# Patient Record
Sex: Female | Born: 1955 | Race: White | Hispanic: No | Smoking: Former smoker
Health system: Southern US, Community
[De-identification: ages and names within clinical notes are randomized; demographics above are authoritative.]

## PROBLEM LIST (undated history)

## (undated) DIAGNOSIS — J40 Bronchitis, not specified as acute or chronic: Secondary | ICD-10-CM

## (undated) DIAGNOSIS — D649 Anemia, unspecified: Secondary | ICD-10-CM

## (undated) DIAGNOSIS — B1920 Unspecified viral hepatitis C without hepatic coma: Secondary | ICD-10-CM

## (undated) DIAGNOSIS — I509 Heart failure, unspecified: Secondary | ICD-10-CM

## (undated) DIAGNOSIS — F32A Depression, unspecified: Secondary | ICD-10-CM

## (undated) DIAGNOSIS — E119 Type 2 diabetes mellitus without complications: Secondary | ICD-10-CM

## (undated) DIAGNOSIS — K5792 Diverticulitis of intestine, part unspecified, without perforation or abscess without bleeding: Secondary | ICD-10-CM

## (undated) DIAGNOSIS — I10 Essential (primary) hypertension: Secondary | ICD-10-CM

## (undated) DIAGNOSIS — K219 Gastro-esophageal reflux disease without esophagitis: Secondary | ICD-10-CM

## (undated) DIAGNOSIS — M1990 Unspecified osteoarthritis, unspecified site: Secondary | ICD-10-CM

## (undated) DIAGNOSIS — F329 Major depressive disorder, single episode, unspecified: Secondary | ICD-10-CM

## (undated) DIAGNOSIS — E876 Hypokalemia: Secondary | ICD-10-CM

## (undated) DIAGNOSIS — M549 Dorsalgia, unspecified: Secondary | ICD-10-CM

## (undated) DIAGNOSIS — N289 Disorder of kidney and ureter, unspecified: Secondary | ICD-10-CM

## (undated) HISTORY — PX: SMALL INTESTINE SURGERY: SHX150

## (undated) HISTORY — PX: ABDOMINAL HYSTERECTOMY: SHX81

---

## 2011-11-15 ENCOUNTER — Encounter: Payer: Self-pay | Admitting: Family Medicine

## 2011-11-30 ENCOUNTER — Encounter: Payer: Self-pay | Admitting: Family Medicine

## 2012-03-23 ENCOUNTER — Emergency Department: Payer: Self-pay | Admitting: Emergency Medicine

## 2012-03-23 LAB — URINALYSIS, COMPLETE
Bacteria: NONE SEEN
Bilirubin,UR: NEGATIVE
Glucose,UR: NEGATIVE mg/dL (ref 0–75)
Ketone: NEGATIVE
Leukocyte Esterase: NEGATIVE
Nitrite: NEGATIVE

## 2013-04-26 ENCOUNTER — Emergency Department (HOSPITAL_COMMUNITY): Payer: Medicaid Other

## 2013-04-26 ENCOUNTER — Encounter (HOSPITAL_COMMUNITY): Payer: Self-pay | Admitting: Emergency Medicine

## 2013-04-26 DIAGNOSIS — I10 Essential (primary) hypertension: Secondary | ICD-10-CM | POA: Insufficient documentation

## 2013-04-26 DIAGNOSIS — R072 Precordial pain: Secondary | ICD-10-CM | POA: Insufficient documentation

## 2013-04-26 DIAGNOSIS — I509 Heart failure, unspecified: Secondary | ICD-10-CM | POA: Insufficient documentation

## 2013-04-26 DIAGNOSIS — R0602 Shortness of breath: Secondary | ICD-10-CM | POA: Insufficient documentation

## 2013-04-26 DIAGNOSIS — R071 Chest pain on breathing: Secondary | ICD-10-CM | POA: Insufficient documentation

## 2013-04-26 DIAGNOSIS — F3289 Other specified depressive episodes: Secondary | ICD-10-CM | POA: Insufficient documentation

## 2013-04-26 DIAGNOSIS — I428 Other cardiomyopathies: Secondary | ICD-10-CM | POA: Insufficient documentation

## 2013-04-26 DIAGNOSIS — E876 Hypokalemia: Secondary | ICD-10-CM | POA: Insufficient documentation

## 2013-04-26 DIAGNOSIS — D649 Anemia, unspecified: Secondary | ICD-10-CM | POA: Insufficient documentation

## 2013-04-26 DIAGNOSIS — Z8709 Personal history of other diseases of the respiratory system: Secondary | ICD-10-CM | POA: Insufficient documentation

## 2013-04-26 DIAGNOSIS — X500XXA Overexertion from strenuous movement or load, initial encounter: Secondary | ICD-10-CM | POA: Insufficient documentation

## 2013-04-26 DIAGNOSIS — K219 Gastro-esophageal reflux disease without esophagitis: Secondary | ICD-10-CM | POA: Insufficient documentation

## 2013-04-26 DIAGNOSIS — R0682 Tachypnea, not elsewhere classified: Secondary | ICD-10-CM | POA: Insufficient documentation

## 2013-04-26 DIAGNOSIS — Z8719 Personal history of other diseases of the digestive system: Secondary | ICD-10-CM | POA: Insufficient documentation

## 2013-04-26 DIAGNOSIS — Y929 Unspecified place or not applicable: Secondary | ICD-10-CM | POA: Insufficient documentation

## 2013-04-26 DIAGNOSIS — Y9389 Activity, other specified: Secondary | ICD-10-CM | POA: Insufficient documentation

## 2013-04-26 DIAGNOSIS — R1012 Left upper quadrant pain: Secondary | ICD-10-CM | POA: Insufficient documentation

## 2013-04-26 DIAGNOSIS — R064 Hyperventilation: Secondary | ICD-10-CM | POA: Insufficient documentation

## 2013-04-26 DIAGNOSIS — Z7982 Long term (current) use of aspirin: Secondary | ICD-10-CM | POA: Insufficient documentation

## 2013-04-26 DIAGNOSIS — Z79899 Other long term (current) drug therapy: Secondary | ICD-10-CM | POA: Insufficient documentation

## 2013-04-26 DIAGNOSIS — M129 Arthropathy, unspecified: Secondary | ICD-10-CM | POA: Insufficient documentation

## 2013-04-26 DIAGNOSIS — F329 Major depressive disorder, single episode, unspecified: Secondary | ICD-10-CM | POA: Insufficient documentation

## 2013-04-26 DIAGNOSIS — E119 Type 2 diabetes mellitus without complications: Secondary | ICD-10-CM | POA: Insufficient documentation

## 2013-04-26 DIAGNOSIS — R062 Wheezing: Secondary | ICD-10-CM | POA: Insufficient documentation

## 2013-04-26 DIAGNOSIS — N289 Disorder of kidney and ureter, unspecified: Secondary | ICD-10-CM | POA: Insufficient documentation

## 2013-04-26 NOTE — ED Notes (Signed)
Pt states that yesterday she leaned over the wooden railing and was trying to get a box out of the trash when she felt a pop in her left rib area. Pt states that she has been having pain ever since to her left chest wall.

## 2013-04-27 ENCOUNTER — Other Ambulatory Visit: Payer: Self-pay

## 2013-04-27 ENCOUNTER — Emergency Department (HOSPITAL_COMMUNITY): Payer: Medicaid Other

## 2013-04-27 ENCOUNTER — Emergency Department (HOSPITAL_COMMUNITY)
Admission: EM | Admit: 2013-04-27 | Discharge: 2013-04-27 | Disposition: A | Discharge status: Home / Self Care | Payer: Medicaid Other | Attending: Emergency Medicine | Admitting: Emergency Medicine

## 2013-04-27 DIAGNOSIS — I509 Heart failure, unspecified: Secondary | ICD-10-CM

## 2013-04-27 DIAGNOSIS — R0789 Other chest pain: Secondary | ICD-10-CM

## 2013-04-27 DIAGNOSIS — E876 Hypokalemia: Secondary | ICD-10-CM

## 2013-04-27 HISTORY — DX: Bronchitis, not specified as acute or chronic: J40

## 2013-04-27 HISTORY — DX: Unspecified osteoarthritis, unspecified site: M19.90

## 2013-04-27 HISTORY — DX: Major depressive disorder, single episode, unspecified: F32.9

## 2013-04-27 HISTORY — DX: Unspecified viral hepatitis C without hepatic coma: B19.20

## 2013-04-27 HISTORY — DX: Disorder of kidney and ureter, unspecified: N28.9

## 2013-04-27 HISTORY — DX: Anemia, unspecified: D64.9

## 2013-04-27 HISTORY — DX: Type 2 diabetes mellitus without complications: E11.9

## 2013-04-27 HISTORY — DX: Gastro-esophageal reflux disease without esophagitis: K21.9

## 2013-04-27 HISTORY — DX: Essential (primary) hypertension: I10

## 2013-04-27 HISTORY — DX: Heart failure, unspecified: I50.9

## 2013-04-27 HISTORY — DX: Diverticulitis of intestine, part unspecified, without perforation or abscess without bleeding: K57.92

## 2013-04-27 HISTORY — DX: Dorsalgia, unspecified: M54.9

## 2013-04-27 HISTORY — DX: Depression, unspecified: F32.A

## 2013-04-27 HISTORY — DX: Hypokalemia: E87.6

## 2013-04-27 LAB — CBC WITH DIFFERENTIAL/PLATELET
Basophils Relative: 0 % (ref 0–1)
Eosinophils Absolute: 0.1 10*3/uL (ref 0.0–0.7)
HCT: 31.3 % — ABNORMAL LOW (ref 36.0–46.0)
Hemoglobin: 10.4 g/dL — ABNORMAL LOW (ref 12.0–15.0)
Lymphocytes Relative: 25 % (ref 12–46)
Lymphs Abs: 2.2 10*3/uL (ref 0.7–4.0)
MCH: 29.6 pg (ref 26.0–34.0)
MCHC: 33.2 g/dL (ref 30.0–36.0)
MCV: 89.2 fL (ref 78.0–100.0)
Monocytes Absolute: 0.6 10*3/uL (ref 0.1–1.0)
Monocytes Relative: 6 % (ref 3–12)
Neutrophils Relative %: 68 % (ref 43–77)
RBC: 3.51 MIL/uL — ABNORMAL LOW (ref 3.87–5.11)

## 2013-04-27 LAB — BASIC METABOLIC PANEL
BUN: 28 mg/dL — ABNORMAL HIGH (ref 6–23)
CO2: 27 mEq/L (ref 19–32)
Chloride: 100 mEq/L (ref 96–112)
GFR calc non Af Amer: 57 mL/min — ABNORMAL LOW (ref >90)
Glucose, Bld: 258 mg/dL — ABNORMAL HIGH (ref 70–99)
Potassium: 2.8 mEq/L — ABNORMAL LOW (ref 3.5–5.1)

## 2013-04-27 MED ORDER — HYDROCODONE-ACETAMINOPHEN 5-325 MG PO TABS: 2.0000 | ORAL_TABLET | ORAL | Status: DC | PRN

## 2013-04-27 MED ORDER — POTASSIUM CHLORIDE ER 10 MEQ PO TBCR: 10.0000 meq | EXTENDED_RELEASE_TABLET | Freq: Every day | ORAL | Status: DC

## 2013-04-27 MED ORDER — POTASSIUM CHLORIDE CRYS ER 20 MEQ PO TBCR
40.0000 meq | EXTENDED_RELEASE_TABLET | Freq: Two times a day (BID) | ORAL | Status: DC
  Administered 2013-04-27: 40 meq via ORAL
  Filled 2013-04-27: qty 2

## 2013-04-27 MED ORDER — OXYCODONE-ACETAMINOPHEN 5-325 MG PO TABS
2.0000 | ORAL_TABLET | Freq: Once | ORAL | Status: AC
  Administered 2013-04-27: 2 via ORAL
  Filled 2013-04-27: qty 2

## 2013-04-27 MED ORDER — NAPROXEN 500 MG PO TABS: 500.0000 mg | ORAL_TABLET | Freq: Two times a day (BID) | ORAL | Status: DC

## 2013-04-27 NOTE — ED Provider Notes (Signed)
Medical screening examination/treatment/procedure(s) were performed by non-physician practitioner and as supervising physician I was immediately available for consultation/collaboration.  EKG Interpretation   None        Mikie Misner, MD 04/27/13 0213 

## 2013-04-27 NOTE — ED Provider Notes (Signed)
MSE was initiated and I personally evaluated the patient and placed orders (if any) at  1:34 AM on April 27, 2013.  Patient is a 57 year old female with a history of diabetes mellitus and congestive heart failure her presents today for left anterior chest wall pain with onset yesterday after leaning over a wooden railing to get a box out of the recycling bin. Patient has been having constant pain radiating down her left side since this time it is not relieved with gabapentin. She has not taken anything else for pain symptoms. Pain worse with deep breathing.  Patient states that, as she was sitting in the waiting room, she began to develop a substernal chest pain that is nonradiating as well as increasing shortness of breath. Patient becomes tearful and tachypneic; patient hyperventilating. Suspect symptoms secondary to anxiety and pain with inspiration; however, given her history, believe futher work up warranted on acute care side. Heart RRR and lungs CTAB on my initial exam with focal TTP to L lower anterior chest wall. EKG, troponin, BNP, and basic labs ordered. Patient hemodynamically stable.  The patient appears stable so that the remainder of the MSE may be completed by another provider.  Antony Madura, PA-C 04/27/13 0139

## 2013-04-27 NOTE — ED Provider Notes (Addendum)
CSN: [id number]     Arrival date & time 04/26/13  2208 History   First MD Initiated Contact with Patient 04/27/13 0054     Chief Complaint  Patient presents with  . Rib Injury   (Consider location/radiation/quality/duration/timing/severity/associated sxs/prior Treatment) HPI Comments: Pt is a 57 y/o female with hx of DM, Htn, CHF and chronic back pain who presents the day after she leaned over a recycling bin and felt acute onset of a pop in her L side - this is severe persistent and worse with movement, palpation ver the chest wall and berathing.  She has no cough and nos welling of the LE's.  She does feel SOB and while in the waiting room she developed CP which was in her mid chest and not associated with the L sided CP.    The history is provided by the patient.    Past Medical History  Diagnosis Date  . Hypertension   . Diabetes mellitus without complication   . Anemia   . GERD (gastroesophageal reflux disease)   . Arthritis   . Depression   . Hepatitis C   . Hypokalemia   . Diverticulitis   . Bronchitis   . Renal disorder   . Back pain   . CHF (congestive heart failure)    Past Surgical History  Procedure Laterality Date  . Cesarean section    . Abdominal hysterectomy     History reviewed. No pertinent family history. History  Substance Use Topics  . Smoking status: Never Smoker   . Smokeless tobacco: Not on file  . Alcohol Use: No   OB History   Grav Para Term Preterm Abortions TAB SAB Ect Mult Living                 Review of Systems  All other systems reviewed and are negative.    Allergies  Review of patient's allergies indicates no known allergies.  Home Medications   Current Outpatient Rx  Name  Route  Sig  Dispense  Refill  . acetaminophen (TYLENOL) 500 MG tablet   Oral   Take 500-1,000 mg by mouth every 6 (six) hours as needed.         Marland Kitchen albuterol (PROVENTIL HFA;VENTOLIN HFA) 108 (90 BASE) MCG/ACT inhaler   Inhalation   Inhale 1-2 puffs  into the lungs every 6 (six) hours as needed for wheezing or shortness of breath.         Marland Kitchen aspirin EC 81 MG tablet   Oral   Take 81 mg by mouth daily.         Marland Kitchen atorvastatin (LIPITOR) 40 MG tablet   Oral   Take 40 mg by mouth daily.         . budesonide-formoterol (SYMBICORT) 80-4.5 MCG/ACT inhaler   Inhalation   Inhale 2 puffs into the lungs 2 (two) times daily.         . carvedilol (COREG) 12.5 MG tablet   Oral   Take 12.5 mg by mouth 2 (two) times daily with a meal.         . chlorthalidone (HYGROTON) 50 MG tablet   Oral   Take 50 mg by mouth daily.         . cloNIDine (CATAPRES) 0.1 MG tablet   Oral   Take 0.1 mg by mouth 3 (three) times daily.         . fluticasone (FLONASE) 50 MCG/ACT nasal spray   Each Nare   Place 2  sprays into both nostrils daily.         Marland Kitchen gabapentin (NEURONTIN) 300 MG capsule   Oral   Take 300 mg by mouth 3 (three) times daily.         . hydrALAZINE (APRESOLINE) 100 MG tablet   Oral   Take 100 mg by mouth 3 (three) times daily.         Marland Kitchen lisinopril (PRINIVIL,ZESTRIL) 40 MG tablet   Oral   Take 40 mg by mouth daily.         . metFORMIN (GLUCOPHAGE) 500 MG tablet   Oral   Take 500 mg by mouth 2 (two) times daily with a meal.         . omeprazole (PRILOSEC) 20 MG capsule   Oral   Take 20 mg by mouth 2 (two) times daily before a meal.         . sertraline (ZOLOFT) 100 MG tablet   Oral   Take 150 mg by mouth daily.         Marland Kitchen HYDROcodone-acetaminophen (NORCO/VICODIN) 5-325 MG per tablet   Oral   Take 2 tablets by mouth every 4 (four) hours as needed.   10 tablet   0   . naproxen (NAPROSYN) 500 MG tablet   Oral   Take 1 tablet (500 mg total) by mouth 2 (two) times daily with a meal.   30 tablet   0   . potassium chloride (K-DUR) 10 MEQ tablet   Oral   Take 1 tablet (10 mEq total) by mouth daily.   7 tablet   0    BP 186/80  Pulse 59  Temp(Src) 98 F (36.7 C) (Oral)  Resp 18  Ht 5\' 1"  (1.549  m)  Wt 214 lb 9 oz (97.325 kg)  BMI 40.56 kg/m2  SpO2 99% Physical Exam  Nursing note and vitals reviewed. Constitutional: She appears well-developed and well-nourished.  HENT:  Head: Normocephalic and atraumatic.  Mouth/Throat: Oropharynx is clear and moist. No oropharyngeal exudate.  Eyes: Conjunctivae and EOM are normal. Pupils are equal, round, and reactive to light. Right eye exhibits no discharge. Left eye exhibits no discharge. No scleral icterus.  Neck: Normal range of motion. Neck supple. No JVD present. No thyromegaly present.  Cardiovascular: Normal rate, regular rhythm, normal heart sounds and intact distal pulses.  Exam reveals no gallop and no friction rub.   No murmur heard. Pulmonary/Chest: Effort normal. No respiratory distress. She has wheezes. She has no rales. She exhibits tenderness ( L sided Chest tenderness along 5th rib line laterally).  Abdominal: Soft. Bowel sounds are normal. She exhibits no distension and no mass. There is tenderness ( LUQ ttp, no guarding).  Musculoskeletal: Normal range of motion. She exhibits no edema and no tenderness.  Lymphadenopathy:    She has no cervical adenopathy.  Neurological: She is alert. Coordination normal.  Skin: Skin is warm and dry. No rash noted. No erythema.  Psychiatric: She has a normal mood and affect. Her behavior is normal.    ED Course  Procedures (including critical care time) Labs Review Labs Reviewed  PRO B NATRIURETIC PEPTIDE - Abnormal; Notable for the following:    Pro B Natriuretic peptide (BNP) 1025.0 (*)    All other components within normal limits  CBC WITH DIFFERENTIAL - Abnormal; Notable for the following:    RBC 3.51 (*)    Hemoglobin 10.4 (*)    HCT 31.3 (*)    Platelets 120 (*)  All other components within normal limits  BASIC METABOLIC PANEL - Abnormal; Notable for the following:    Potassium 2.8 (*)    Glucose, Bld 258 (*)    BUN 28 (*)    GFR calc non Af Amer 57 (*)    GFR calc Af  Amer 66 (*)    All other components within normal limits  TROPONIN I   Imaging Review Dg Chest 2 View  04/27/2013   CLINICAL DATA:  Left chest pain  EXAM: CHEST  2 VIEW  COMPARISON:  None.  FINDINGS: Mild cardiomegaly with slight vascular prominence. No CHF or pneumonia. Negative for effusion or pneumothorax. Trachea midline. Mild thoracic degenerative change.  IMPRESSION: Cardiomegaly.  No acute process   Electronically Signed   By: Ruel Favors M.D.   On: 04/27/2013 00:00   Dg Ribs Unilateral W/chest Left  04/27/2013   CLINICAL DATA:  Left mid axillary rib pain.  EXAM: LEFT RIBS AND CHEST - 3+ VIEW  COMPARISON:  Chest radiograph performed 04/26/2013  FINDINGS: No displaced rib fractures are seen.  The visualized portions of the lungs are well-aerated and clear. There is no evidence of focal opacification, pleural effusion or pneumothorax. The right lung incompletely imaged on this study.  The cardiomediastinal silhouette is mildly enlarged. No acute osseous abnormalities are seen.  IMPRESSION: No displaced rib fractures seen; no acute cardiopulmonary process identified. Mild cardiomegaly noted.   Electronically Signed   By: Roanna Raider M.D.   On: 04/27/2013 02:25    ED ECG REPORT  I personally interpreted this EKG   Date: 04/27/2013   Rate: 63  Rhythm: normal sinus rhythm  QRS Axis: left  Intervals: normal  ST/T Wave abnormalities: normal  Conduction Disutrbances:none  Narrative Interpretation:   Old EKG Reviewed: none available   MDM   1. Chest wall pain   2. Hypokalemia   3. CHF (congestive heart failure)    Pt has some hypertension - I suspect rib injury / strain of muscle, also ahs wheezing which could be lung or CHF related.  Needs further w/u as the CXR is without rib injury - klabs to eval for acute CHF.  Labs show normal WBC, normal renal function - has slight anemia and elevated BNP - she has hx of CHF and has no peripheral edema, on recheck no wheezing and potassium  given for low K.  She agrees to have f/u visit to have recheck of K and pain.  Suspect chest wall strain as there is no fracture seen on xray.  Meds given in ED:  Medications  potassium chloride SA (K-DUR,KLOR-CON) CR tablet 40 mEq (40 mEq Oral Given 04/27/13 0604)  oxyCODONE-acetaminophen (PERCOCET/ROXICET) 5-325 MG per tablet 2 tablet (2 tablets Oral Given 04/27/13 0411)    New Prescriptions   HYDROCODONE-ACETAMINOPHEN (NORCO/VICODIN) 5-325 MG PER TABLET    Take 2 tablets by mouth every 4 (four) hours as needed.   NAPROXEN (NAPROSYN) 500 MG TABLET    Take 1 tablet (500 mg total) by mouth 2 (two) times daily with a meal.   POTASSIUM CHLORIDE (K-DUR) 10 MEQ TABLET    Take 1 tablet (10 mEq total) by mouth daily.      Vida Roller, MD 04/27/13 6295  Vida Roller, MD 04/27/13 [id number]

## 2013-08-13 ENCOUNTER — Ambulatory Visit: Payer: Self-pay | Admitting: Family Medicine

## 2013-08-29 ENCOUNTER — Ambulatory Visit: Payer: Self-pay | Admitting: Family Medicine

## 2013-09-29 ENCOUNTER — Ambulatory Visit: Payer: Self-pay | Admitting: Family Medicine

## 2014-03-05 ENCOUNTER — Emergency Department: Payer: Self-pay | Admitting: Emergency Medicine

## 2014-03-06 LAB — CBC WITH DIFFERENTIAL/PLATELET
BASOS PCT: 0.3 %
Basophil #: 0 10*3/uL (ref 0.0–0.1)
Eosinophil #: 0 10*3/uL (ref 0.0–0.7)
Eosinophil %: 0.4 %
HCT: 30.1 % — ABNORMAL LOW (ref 35.0–47.0)
HGB: 9.7 g/dL — ABNORMAL LOW (ref 12.0–16.0)
LYMPHS PCT: 26.9 %
Lymphocyte #: 1.9 10*3/uL (ref 1.0–3.6)
MCH: 27.8 pg (ref 26.0–34.0)
MCHC: 32.3 g/dL (ref 32.0–36.0)
MCV: 86 fL (ref 80–100)
MONOS PCT: 6.3 %
Monocyte #: 0.5 x10 3/mm (ref 0.2–0.9)
NEUTROS PCT: 66.1 %
Neutrophil #: 4.8 10*3/uL (ref 1.4–6.5)
PLATELETS: 128 10*3/uL — AB (ref 150–440)
RBC: 3.5 10*6/uL — ABNORMAL LOW (ref 3.80–5.20)
RDW: 16.6 % — ABNORMAL HIGH (ref 11.5–14.5)
WBC: 7.2 10*3/uL (ref 3.6–11.0)

## 2014-03-06 LAB — COMPREHENSIVE METABOLIC PANEL
ALBUMIN: 2.6 g/dL — AB (ref 3.4–5.0)
ALT: 52 U/L
AST: 46 U/L — AB (ref 15–37)
Alkaline Phosphatase: 100 U/L
Anion Gap: 10 (ref 7–16)
BUN: 17 mg/dL (ref 7–18)
Bilirubin,Total: 0.2 mg/dL (ref 0.2–1.0)
CO2: 24 mmol/L (ref 21–32)
CREATININE: 1.08 mg/dL (ref 0.60–1.30)
Calcium, Total: 8.3 mg/dL — ABNORMAL LOW (ref 8.5–10.1)
Chloride: 108 mmol/L — ABNORMAL HIGH (ref 98–107)
EGFR (African American): >60
EGFR (Non-African Amer.): 56 — ABNORMAL LOW
GLUCOSE: 217 mg/dL — AB (ref 65–99)
Osmolality: 291 (ref 275–301)
POTASSIUM: 3.3 mmol/L — AB (ref 3.5–5.1)
Sodium: 142 mmol/L (ref 136–145)
Total Protein: 7.3 g/dL (ref 6.4–8.2)

## 2014-03-06 LAB — URINALYSIS, COMPLETE
BILIRUBIN, UR: NEGATIVE
Bacteria: NONE SEEN
Blood: NEGATIVE
KETONE: NEGATIVE
LEUKOCYTE ESTERASE: NEGATIVE
Nitrite: NEGATIVE
Ph: 5 (ref 4.5–8.0)
Protein: 100
RBC,UR: 1 /HPF (ref 0–5)
Specific Gravity: 1.013 (ref 1.003–1.030)
WBC UR: 2 /HPF (ref 0–5)

## 2014-03-06 LAB — TROPONIN I
Troponin-I: <0.02 ng/mL
Troponin-I: <0.02 ng/mL

## 2014-03-06 LAB — LIPASE, BLOOD: Lipase: 260 U/L (ref 73–393)

## 2014-03-20 ENCOUNTER — Ambulatory Visit: Payer: Self-pay | Admitting: Gastroenterology

## 2014-03-23 ENCOUNTER — Ambulatory Visit: Payer: Self-pay | Admitting: Gastroenterology

## 2014-08-12 ENCOUNTER — Inpatient Hospital Stay
Admit: 2014-08-12 | Disposition: A | Discharge status: Home / Self Care | Payer: Self-pay | Attending: Internal Medicine | Admitting: Internal Medicine

## 2014-08-12 LAB — COMPREHENSIVE METABOLIC PANEL
Albumin: 3.1 g/dL — ABNORMAL LOW
Alkaline Phosphatase: 94 U/L
Anion Gap: 8 (ref 7–16)
BILIRUBIN TOTAL: 0.5 mg/dL
BUN: 57 mg/dL — AB
CHLORIDE: 111 mmol/L
CREATININE: 3.83 mg/dL — AB
Calcium, Total: 8.4 mg/dL — ABNORMAL LOW
Co2: 18 mmol/L — ABNORMAL LOW
EGFR (African American): 14 — ABNORMAL LOW
EGFR (Non-African Amer.): 12 — ABNORMAL LOW
GLUCOSE: 154 mg/dL — AB
POTASSIUM: 4.3 mmol/L
SGOT(AST): 28 U/L
SGPT (ALT): 29 U/L
Sodium: 137 mmol/L
TOTAL PROTEIN: 7.1 g/dL

## 2014-08-12 LAB — CBC
HCT: 30.1 % — AB (ref 35.0–47.0)
HGB: 9.6 g/dL — ABNORMAL LOW (ref 12.0–16.0)
MCH: 27.9 pg (ref 26.0–34.0)
MCHC: 31.9 g/dL — ABNORMAL LOW (ref 32.0–36.0)
MCV: 87 fL (ref 80–100)
Platelet: 136 10*3/uL — ABNORMAL LOW (ref 150–440)
RBC: 3.45 10*6/uL — AB (ref 3.80–5.20)
RDW: 16.2 % — ABNORMAL HIGH (ref 11.5–14.5)
WBC: 6.9 10*3/uL (ref 3.6–11.0)

## 2014-08-12 LAB — URINALYSIS, COMPLETE
BACTERIA: NONE SEEN
BILIRUBIN, UR: NEGATIVE
BLOOD: NEGATIVE
Glucose,UR: NEGATIVE mg/dL (ref 0–75)
Ketone: NEGATIVE
Leukocyte Esterase: NEGATIVE
Nitrite: NEGATIVE
PH: 5 (ref 4.5–8.0)
Protein: 100
Specific Gravity: 1.021 (ref 1.003–1.030)

## 2014-08-12 LAB — AMMONIA: Ammonia, Plasma: 33 umol/L

## 2014-08-12 LAB — TROPONIN I: Troponin-I: <0.03 ng/mL

## 2014-08-13 LAB — CBC WITH DIFFERENTIAL/PLATELET
Basophil #: 0 10*3/uL (ref 0.0–0.1)
Basophil %: 0.2 %
EOS PCT: 0.8 %
Eosinophil #: 0.1 10*3/uL (ref 0.0–0.7)
HCT: 27.5 % — AB (ref 35.0–47.0)
HGB: 8.8 g/dL — ABNORMAL LOW (ref 12.0–16.0)
LYMPHS ABS: 1.7 10*3/uL (ref 1.0–3.6)
Lymphocyte %: 26.5 %
MCH: 28.2 pg (ref 26.0–34.0)
MCHC: 32.1 g/dL (ref 32.0–36.0)
MCV: 88 fL (ref 80–100)
MONO ABS: 0.4 x10 3/mm (ref 0.2–0.9)
MONOS PCT: 6.3 %
NEUTROS ABS: 4.3 10*3/uL (ref 1.4–6.5)
Neutrophil %: 66.2 %
Platelet: 132 10*3/uL — ABNORMAL LOW (ref 150–440)
RBC: 3.13 10*6/uL — ABNORMAL LOW (ref 3.80–5.20)
RDW: 16.5 % — AB (ref 11.5–14.5)
WBC: 6.5 10*3/uL (ref 3.6–11.0)

## 2014-08-13 LAB — BASIC METABOLIC PANEL
ANION GAP: 6 — AB (ref 7–16)
BUN: 58 mg/dL — ABNORMAL HIGH
CO2: 18 mmol/L — AB
Calcium, Total: 7.7 mg/dL — ABNORMAL LOW
Chloride: 116 mmol/L — ABNORMAL HIGH
Creatinine: 3.09 mg/dL — ABNORMAL HIGH
EGFR (African American): 19 — ABNORMAL LOW
EGFR (Non-African Amer.): 16 — ABNORMAL LOW
Glucose: 222 mg/dL — ABNORMAL HIGH
Potassium: 4.6 mmol/L
Sodium: 140 mmol/L

## 2014-08-14 LAB — BASIC METABOLIC PANEL
Anion Gap: 2 — ABNORMAL LOW (ref 7–16)
BUN: 40 mg/dL — ABNORMAL HIGH
CALCIUM: 8.3 mg/dL — AB
CO2: 20 mmol/L — AB
CREATININE: 1.41 mg/dL — AB
Chloride: 119 mmol/L — ABNORMAL HIGH
EGFR (African American): 48 — ABNORMAL LOW
EGFR (Non-African Amer.): 42 — ABNORMAL LOW
Glucose: 185 mg/dL — ABNORMAL HIGH
Potassium: 4.2 mmol/L
SODIUM: 141 mmol/L

## 2014-08-17 LAB — CULTURE, BLOOD (SINGLE)

## 2014-08-24 LAB — SURGICAL PATHOLOGY

## 2014-08-30 NOTE — H&P (Signed)
PATIENT NAME:  Jamie Austin, Jamie Austin MR#:  [id number] DATE OF BIRTH:  1955/10/28  DATE OF ADMISSION:  08/12/2014  CHIEF COMPLAINT: Altered mental status.   HISTORY OF PRESENT ILLNESS: This is a 59 year old female who went to her primary care physician's office today for some neck and back pain. This is a patient who has hepatitis C with subsequent cirrhosis. She is currently on hepatitis C treatment and she states that she got a "hep shot" last Monday as part of her hepatitis C treatment, that was Monday, just over a week ago. Then she had tooth extraction last Tuesday, just over a week ago. For the past 3-4 days, she has been having intermittent fevers, rhinorrhea, headache, nausea with no vomiting, although she has been having diarrhea. She states she has not been having any cough She also denies muscle aches and joint pains. She says that she had started feeling somewhat better, but then at her physician's office today she was very sleepy throughout the visit. Her blood pressure was low, and so they sent her here. In the ED, she was found to have a significant AKI with creatinine of 3.83 as well as a low blood pressure, which responded somewhat to fluids. Hospitalists were called for admission for acute kidney injury.   PRIMARY CARE PHYSICIAN: Lanora Manis B. White, FNP   PAST MEDICAL HISTORY: Cirrhosis, diastolic congestive heart failure, GERD, depression, hypertension, diabetes mellitus, hepatitis C.   MEDICATIONS: She takes Delmar Landau she takes 1 pack daily; this includes medications ombitasvir 12.5 mg, paritaprevir 75 mg, ritonavir 50 mg; she takes those 3 in the morning and then dasabuvir 250 mg, she takes in the evening. She is also taking sertraline 100 mg 1-1/2 tablets daily, potassium 20 mEq b.i.d., Prilosec 20 mEq daily, metformin 1000 mg b.i.d., Mag-Ox, lisinopril 40 mg daily, HCTZ/triamterene combo pill 25/37.5 mg daily, hydralazine 100 mg t.i.d., gabapentin 800 mg t.i.d., furosemide 20 mg daily, iron,  clonidine 0.2 mg b.i.d., Coreg 12.5 mg b.i.d., atorvastatin 40 mg daily, aspirin 81 mg daily, albuterol inhaler 2 puffs q. 6 hours as needed, amoxicillin 500 mg t.i.d.     PAST SURGICAL HISTORY: Hysterectomy and C-section x 2.   ALLERGIES: No known drug allergies.   FAMILY HISTORY: Hypertension, CAD, diabetes mellitus, cancer.   SOCIAL HISTORY: Ex-smoker, quit 4 or 5 years ago. She denies alcohol use. States that she used marijuana and IV cocaine a long time ago, has not used any illicit drugs for the last 20 years.   REVIEW OF SYSTEMS:  CONSTITUTIONAL: Endorses subjective fever, fatigue, and weakness.  EYES: Denies blurred or double vision, pain or redness.  EAR, NOSE, AND THROAT: Denies ear pain, hearing loss, difficulty swallowing.  RESPIRATORY: Denies cough, dyspnea, or painful respiration.  CARDIOVASCULAR: Denies chest pain, edema, or palpitations.  GASTROINTESTINAL: Endorses nausea. Denies vomiting. Endorses diarrhea. Denies abdominal pain or constipation.  GENITOURINARY: Denies dysuria, hematuria, or frequency.  ENDOCRINE: Denies nocturia, thyroid problems, heat or cold intolerance.  HEMATOLOGIC AND LYMPHATIC: Denies easy bruising, bleeding, swollen glands.  INTEGUMENTARY: Denies acne, rash, or lesion.  MUSCULOSKELETAL: Endorses some neck and shoulder pain. Denies joint swelling or gout.  NEUROLOGICAL: Denies numbness, dysarthria, or headache.  PSYCHIATRIC: Denies anxiety, insomnia, depression. Other pertinent review of systems points per HPI.   PHYSICAL EXAMINATION:  VITAL SIGNS: Blood pressure 199/58, pulse 59, temperature 97.9, respirations 20 with 98% oxygen saturations on room air.  GENERAL: This is a well-nourished female lying supine in bed in no acute distress.  HEENT: Pupils equal,  round, and reactive to light and accommodation. Extraocular movements intact. No scleral icterus. Dry mucosal membranes.  NECK: Thyroid is not enlarged. Neck is supple, no masses, nontender. No  cervical adenopathy. No JVD.  RESPIRATORY: Clear to auscultation bilaterally with no rales, rhonchi, or wheezing. No respiratory distress.  CARDIOVASCULAR: Regular rate and rhythm with no murmurs, rubs, or gallops on exam. Good pedal pulses with no lower extremity edema.  ABDOMEN: Soft, nontender, nondistended with good bowel sounds.  MUSCULOSKELETAL: Muscular strength 5/5 in all 4 extremities. Full spontaneous range of motion throughout. No cyanosis or clubbing.  SKIN: No rash or lesions. Skin is warm, dry, and intact.  LYMPHATIC: No adenopathy.  NEUROLOGICAL: Cranial nerves intact. Sensation intact throughout. No dysarthria or aphasia.  PSYCHIATRIC: Alert and oriented x 3, cooperative, not confused.   LABORATORY DATA: White count 6.9, hemoglobin 9.6, hematocrit 30.1, platelets 136,000. Sodium 137, potassium 4.3, chloride 111, bicarbonate 18, BUN 57, creatinine 3.83, glucose 154, calcium 8.4. Total protein 7.1, albumin 3.1, total bilirubin 0.5, alkaline phosphatase 94, AST 28, ALT 29. Troponin less than 0.03. Urinalysis negative. Ammonia 33.   RADIOLOGY: Chest x-ray: Cardiomegaly with vascular congestion. Head CT: Indeterminate 6 mm hypoattenuating focus just lateral to the right caudate head may represent an acute to subacute lacunar infarct, dilated perivascular space or sequela chronic microvascular ischemic changes, left posterior fossa subarachnoid cyst.   ASSESSMENT AND PLAN:  1.  Acute kidney injury. This is likely due to dehydration from her viral illness and her diarrhea as well as maybe some of her antihypertensive medications. We will rehydrate her here with fluids. We will hold her antihypertensives for now. We will hold all renal toxic medications and we will continue to monitor her.  2.  Hypotension. This is fluid responsive. We will hold her antihypertensive medications for now except for clonidine to avoid withdrawal, which we will restart at a lower dose.  3.  Recent tooth  extraction. The patient is on amoxicillin for this. We will continue her amoxicillin for now but we will change the dosing to q. 12 due to her renal status.  4.  Diabetes mellitus. We will hold her metformin due to her renal failure. We will put her on sliding scale insulin and order a low sodium diabetic diet.  5.  Hepatitis C. We will continue her treatment medications for this.  6.  Gastroesophageal reflux disease. We will have her on Protonix while here in the hospital.  7.  Depression. We will continue her selective serotonin reuptake inhibitor while inpatient.  8.  Deep vein thrombosis prophylaxis. Subcutaneous Lovenox heparin.   CODE STATUS: This patient is full code.   TIME SPENT ON THIS ADMISSION: 50 minutes.    ____________________________ Candace Cruiseavid F. Anne HahnWillis, MD dfw:bm D: 08/13/2014 01:51:29 ET T: 08/13/2014 02:28:13 ET JOB#: [id number]  cc: Candace Cruiseavid F. Anne HahnWillis, MD, <Dictator> Asif Muchow Scotty CourtF Brenae Lasecki MD ELECTRONICALLY SIGNED 08/13/2014 5:20

## 2014-08-30 NOTE — Discharge Summary (Signed)
PATIENT NAME:  Jamie ReichmannJEFFERS, Nakeisha MR#:  [id number] DATE OF BIRTH:  1955/05/11  DATE OF ADMISSION:  08/12/2014 DATE OF DISCHARGE:  08/14/2014  DISCHARGE DIAGNOSES:  1.  Acute renal failure. 2.  Altered mental status, likely metabolic encephalopathy. 3.  History of hepatitis C. 4.  Diabetes. 5.  Depression.  DISPOSITION: The patient is being discharged home with home health, physical therapy and nursing services.  ACTIVITY: As tolerated.  DISCHARGE INSTRUCTIONS: Follow up with Ms. Doristine Mangolizabeth White and Dr. Mady HaagensenMunsoor Lateef in the next 1 to 2 weeks.  DISCHARGE MEDICATIONS: Zoloft 100 mg 1-1/2 tabs daily, aspirin 81 mg daily, albuterol inhaler 2 puffs every 6 hours as needed, clonidine 0.2 mg b.i.d., Lasix 20 mg daily, hydralazine 100 mg t.i.d., omeprazole 20 mg b.i.d., Gabapentin 800 mg t.i.d., atorvastatin 40 mg daily, Coreg 12.5 mg b.i.d., iron sulfate 325 mg daily, lisinopril 40 mg daily, magnesium oxide daily, Metformin 1000 mg b.i.d., nystatin topical to be applied b.i.d. as needed, Viekira 1 dose b.i.d., potassium 20 mEq b.i.d., hydrochlorothiazide/triamterene 1 tab daily.  NOTE: The patient is to hold her Lasix, triamterene/ HCTZ and Aldactone through the weekend and to resume on Monday.  CONSULTANTS DURING HOSPITAL COURSE: Dr. Mady HaagensenMunsoor Lateef from nephrology.  PERTINENT STUDIES DURING HOSPITAL COURSE: CT scan of the head done without contrast showed no evidence of any acute intracranial process. Left posterior fossa subarachnoid cyst. Chronic microvascular ischemic change.   Chest x-ray done on April 13th showed cardiomegaly with vascular congestion.  Ultrasound of the kidney showed no evidence of hydronephrosis, right renal cortical atrophy, small right renal cyst.  BRIEF HOSPITAL COURSE: This is a 59 year old female with history of hep-C, diabetes, hypertension, hyperlipidemia, and depression who presented to the hospital with altered mental status and noted to be in acute renal failure.  1.   Acute renal failure. This is likely due to volume loss and dehydration. The patient was apparently on multiple diuretics and had poor p.o. intake. The patient was admitted to the hospital, started on aggressive IV fluid hydration. Her creatinine has significantly improved, is now close to baseline. Her renal ultrasound showed no evidence of any hydronephrosis. Since her renal function is close to baseline, she is therefore being discharged to home. She is still strongly advised to hold her diuretics until her p.o. intake improves, which would likely be in the next 2 to 3 days. 2.  Altered mental status, encephalopathy. This is likely suspected to be secondary to metabolic encephalopathy from acute renal failure. Her ammonia level was normal; therefore, there was no evidence of hepatic encephalopathy. She had a CT of head done which showed a possible subacute/acute CVA, although that is unlikely the cause of the patient's mental status change. Her urinalysis is negative. As her renal function has improved, her mental status is now back to baseline. 3.  Hypertension. The patient remained hemodynamically stable. She will continue her clonidine. 4.  Diabetes. The patient was maintained on some sliding scale insulin due to her acute renal failure, although she will resume her metformin as her creatinine is back to baseline. 5.  Depression. The patient was maintained on her Zoloft. She will resume that. 6.  History of hepatitis C. The patient was maintained on her Heloise PurpuraViekira, which she will continue. 7.  Hyperlipidemia. The patient was maintained on atorvastatin. She will resume that.  The patient is a FULL code. She is being discharged home with home health physical therapy and nursing services.   TIME SPENT ON DISCHARGE: 45 minutes.  ____________________________ Rolly Pancake. Cherlynn Kaiser, MD vjs:sb D: 08/14/2014 16:16:51 ET T: 08/14/2014 17:19:20 ET JOB#: [id number]  cc: Rolly Pancake. Cherlynn Kaiser, MD, <Dictator> Munsoor Lizabeth Leyden, MD Courtney Paris. [place], FNP [place] Siren MD ELECTRONICALLY SIGNED 08/19/2014 11:31

## 2014-09-07 ENCOUNTER — Other Ambulatory Visit: Payer: Self-pay

## 2014-10-07 ENCOUNTER — Emergency Department
Admission: EM | Admit: 2014-10-07 | Discharge: 2014-10-07 | Disposition: A | Discharge status: Home / Self Care | Payer: Medicaid Other | Attending: Emergency Medicine | Admitting: Emergency Medicine

## 2014-10-07 DIAGNOSIS — I10 Essential (primary) hypertension: Secondary | ICD-10-CM | POA: Insufficient documentation

## 2014-10-07 DIAGNOSIS — Z87891 Personal history of nicotine dependence: Secondary | ICD-10-CM | POA: Diagnosis not present

## 2014-10-07 DIAGNOSIS — E1165 Type 2 diabetes mellitus with hyperglycemia: Secondary | ICD-10-CM | POA: Insufficient documentation

## 2014-10-07 DIAGNOSIS — Z7982 Long term (current) use of aspirin: Secondary | ICD-10-CM | POA: Diagnosis not present

## 2014-10-07 DIAGNOSIS — Z7951 Long term (current) use of inhaled steroids: Secondary | ICD-10-CM | POA: Insufficient documentation

## 2014-10-07 DIAGNOSIS — R109 Unspecified abdominal pain: Secondary | ICD-10-CM | POA: Insufficient documentation

## 2014-10-07 DIAGNOSIS — Z79899 Other long term (current) drug therapy: Secondary | ICD-10-CM | POA: Insufficient documentation

## 2014-10-07 DIAGNOSIS — R739 Hyperglycemia, unspecified: Secondary | ICD-10-CM

## 2014-10-07 LAB — BASIC METABOLIC PANEL
ANION GAP: 10 (ref 5–15)
BUN: 27 mg/dL — ABNORMAL HIGH (ref 6–20)
CHLORIDE: 108 mmol/L (ref 101–111)
CO2: 22 mmol/L (ref 22–32)
Calcium: 8.9 mg/dL (ref 8.9–10.3)
Creatinine, Ser: 0.94 mg/dL (ref 0.44–1.00)
GFR calc Af Amer: >60 mL/min (ref >60)
GFR calc non Af Amer: >60 mL/min (ref >60)
GLUCOSE: 211 mg/dL — AB (ref 65–99)
Potassium: 3.7 mmol/L (ref 3.5–5.1)
Sodium: 140 mmol/L (ref 135–145)

## 2014-10-07 LAB — URINALYSIS COMPLETE WITH MICROSCOPIC (ARMC ONLY)
Bacteria, UA: NONE SEEN
Bilirubin Urine: NEGATIVE
Glucose, UA: >500 mg/dL — AB
HGB URINE DIPSTICK: NEGATIVE
KETONES UR: NEGATIVE mg/dL
Leukocytes, UA: NEGATIVE
Nitrite: NEGATIVE
PH: 6 (ref 5.0–8.0)
Protein, ur: NEGATIVE mg/dL
Specific Gravity, Urine: 1.009 (ref 1.005–1.030)

## 2014-10-07 LAB — CBC WITH DIFFERENTIAL/PLATELET
Basophils Absolute: 0 10*3/uL (ref 0–0.1)
Basophils Relative: 0 %
Eosinophils Absolute: 0 10*3/uL (ref 0–0.7)
Eosinophils Relative: 0 %
HCT: 28.3 % — ABNORMAL LOW (ref 35.0–47.0)
Hemoglobin: 9.3 g/dL — ABNORMAL LOW (ref 12.0–16.0)
Lymphocytes Relative: 21 %
Lymphs Abs: 1.4 10*3/uL (ref 1.0–3.6)
MCH: 29.4 pg (ref 26.0–34.0)
MCHC: 32.7 g/dL (ref 32.0–36.0)
MCV: 89.8 fL (ref 80.0–100.0)
MONO ABS: 0.3 10*3/uL (ref 0.2–0.9)
Neutro Abs: 5 10*3/uL (ref 1.4–6.5)
Neutrophils Relative %: 74 %
PLATELETS: 90 10*3/uL — AB (ref 150–440)
RBC: 3.15 MIL/uL — ABNORMAL LOW (ref 3.80–5.20)
RDW: 15.8 % — AB (ref 11.5–14.5)
WBC: 6.8 10*3/uL (ref 3.6–11.0)

## 2014-10-07 LAB — GLUCOSE, CAPILLARY: Glucose-Capillary: 94 mg/dL (ref 65–99)

## 2014-10-07 LAB — HEPATIC FUNCTION PANEL
ALK PHOS: 68 U/L (ref 38–126)
ALT: 17 U/L (ref 14–54)
AST: 25 U/L (ref 15–41)
Albumin: 3.3 g/dL — ABNORMAL LOW (ref 3.5–5.0)
Bilirubin, Direct: <0.1 mg/dL — ABNORMAL LOW (ref 0.1–0.5)
Total Bilirubin: 0.3 mg/dL (ref 0.3–1.2)
Total Protein: 7.5 g/dL (ref 6.5–8.1)

## 2014-10-07 LAB — LIPASE, BLOOD: LIPASE: 66 U/L — AB (ref 22–51)

## 2014-10-07 MED ORDER — SODIUM CHLORIDE 0.9 % IV BOLUS (SEPSIS)
500.0000 mL | Freq: Once | INTRAVENOUS | Status: AC
  Administered 2014-10-07: 500 mL via INTRAVENOUS

## 2014-10-07 NOTE — Discharge Instructions (Signed)

## 2014-10-07 NOTE — ED Notes (Signed)
Blood sugar was low last night 61. Did take metformin last night around 8PM. Woke up this AM and did not feel good. Was 96 this AM. Ate cereal with some sugar and checked again and was about 344. Called MD's office and they called EMS for her.

## 2014-10-07 NOTE — ED Provider Notes (Signed)
[facility] Emergency Department Provider Note  ____________________________________________  Time seen: 12:15 PM  I have reviewed the triage vital signs and the nursing notes.   HISTORY  Chief Complaint Hyperglycemia and Hypertension    HPI Jamie Austin is a 59 y.o. female who complains of hyperglycemia with associated "numbness" of the head. She has that she's had trouble controlling her blood sugar since yesterday. Her blood sugar was low last night around 60, so she ate cake and had juice before going to bed. She felt tired at that time but without any other symptoms. She woke up this morning and continued to feel sluggish found her blood sugar was in the 90s. She ate cereal with extra sugar added and when she rechecked her blood sugar was about 350. Around that time she started to have the above head sensation. Denies numbness tingling or weakness in the extremities, vision change, abdominal pain nausea vomiting diarrhea, chest pain shortness of breath, cough sore throat runny nose, dysuria frequency urgency hematuria. No polyuria or polydipsia  Her primary care doctor recently added another hypoglycemic medicine, glipizide, about 3 weeks ago. The patient reports that she has been taking it since then.   Past Medical History  Diagnosis Date  . Hypertension   . Diabetes mellitus without complication   . Anemia   . GERD (gastroesophageal reflux disease)   . Arthritis   . Depression   . Hepatitis C   . Hypokalemia   . Diverticulitis   . Bronchitis   . Renal disorder   . Back pain   . CHF (congestive heart failure)     There are no active problems to display for this patient.   Past Surgical History  Procedure Laterality Date  . Cesarean section    . Abdominal hysterectomy      Current Outpatient Rx  Name  Route  Sig  Dispense  Refill  . acetaminophen (TYLENOL) 500 MG tablet   Oral   Take 500-1,000 mg by mouth every 6 (six) hours as needed.        Marland Kitchen albuterol (PROVENTIL HFA;VENTOLIN HFA) 108 (90 BASE) MCG/ACT inhaler   Inhalation   Inhale 1-2 puffs into the lungs every 6 (six) hours as needed for wheezing or shortness of breath.         Marland Kitchen aspirin EC 81 MG tablet   Oral   Take 81 mg by mouth daily.         Marland Kitchen atorvastatin (LIPITOR) 40 MG tablet   Oral   Take 40 mg by mouth daily.         . budesonide-formoterol (SYMBICORT) 80-4.5 MCG/ACT inhaler   Inhalation   Inhale 2 puffs into the lungs 2 (two) times daily.         . carvedilol (COREG) 12.5 MG tablet   Oral   Take 12.5 mg by mouth 2 (two) times daily with a meal.         . chlorthalidone (HYGROTON) 50 MG tablet   Oral   Take 50 mg by mouth daily.         . cloNIDine (CATAPRES) 0.1 MG tablet   Oral   Take 0.1 mg by mouth 3 (three) times daily.         . fluticasone (FLONASE) 50 MCG/ACT nasal spray   Each Nare   Place 2 sprays into both nostrils daily.         Marland Kitchen gabapentin (NEURONTIN) 300 MG capsule   Oral  Take 300 mg by mouth 3 (three) times daily.         . hydrALAZINE (APRESOLINE) 100 MG tablet   Oral   Take 100 mg by mouth 3 (three) times daily.         Marland Kitchen. HYDROcodone-acetaminophen (NORCO/VICODIN) 5-325 MG per tablet   Oral   Take 2 tablets by mouth every 4 (four) hours as needed.   10 tablet   0   . lisinopril (PRINIVIL,ZESTRIL) 40 MG tablet   Oral   Take 40 mg by mouth daily.         . metFORMIN (GLUCOPHAGE) 500 MG tablet   Oral   Take 500 mg by mouth 2 (two) times daily with a meal.         . naproxen (NAPROSYN) 500 MG tablet   Oral   Take 1 tablet (500 mg total) by mouth 2 (two) times daily with a meal.   30 tablet   0   . omeprazole (PRILOSEC) 20 MG capsule   Oral   Take 20 mg by mouth 2 (two) times daily before a meal.         . potassium chloride (K-DUR) 10 MEQ tablet   Oral   Take 1 tablet (10 mEq total) by mouth daily.   7 tablet   0   . sertraline (ZOLOFT) 100 MG tablet   Oral   Take 150 mg  by mouth daily.           Allergies Review of patient's allergies indicates no known allergies.  No family history on file.  Social History History  Substance Use Topics  . Smoking status: Former Smoker    Quit date: 10/06/2009  . Smokeless tobacco: Not on file  . Alcohol Use: No    Review of Systems  Constitutional: No fever or chills. No weight changes Eyes:No blurry vision or double vision.  ENT: No sore throat. Cardiovascular: No chest pain. Respiratory: No dyspnea or cough. Gastrointestinal: Negative for abdominal pain, vomiting and diarrhea.  No BRBPR or melena. Genitourinary: Negative for dysuria, urinary retention, bloody urine, or difficulty urinating. Musculoskeletal: Negative for back pain. No joint swelling or pain. Skin: Negative for rash. Neurological: Negative for headaches, focal weakness or numbness. Positive for unusual cranial sensation. Psychiatric:No anxiety or depression.   Endocrine:No hot/cold intolerance, changes in energy, or sleep difficulty.  10-point ROS otherwise negative.  ____________________________________________   PHYSICAL EXAM:  VITAL SIGNS: ED Triage Vitals  Enc Vitals Group     BP 10/07/14 1210 131/63 mmHg     Pulse Rate 10/07/14 1210 61     Resp --      Temp 10/07/14 1210 98.2 F (36.8 C)     Temp Source 10/07/14 1210 Oral     SpO2 10/07/14 1210 98 %     Weight 10/07/14 1210 229 lb 4.5 oz (104 kg)     Height 10/07/14 1210 5\' 2"  (1.575 m)     Head Cir --      Peak Flow --      Pain Score 10/07/14 1211 0     Pain Loc --      Pain Edu? --      Excl. in GC? --      Constitutional: Alert and oriented. Well appearing and in no distress. Low energy Eyes: No scleral icterus. No conjunctival pallor. PERRL. EOMI ENT   Head: Normocephalic and atraumatic.   Nose: No congestion/rhinnorhea. No septal hematoma   Mouth/Throat: MMM, no pharyngeal  erythema. No peritonsillar mass. No uvula shift.   Neck: No  stridor. No SubQ emphysema. No meningismus. Hematological/Lymphatic/Immunilogical: No cervical lymphadenopathy. Cardiovascular: RRR. Normal and symmetric distal pulses are present in all extremities. No murmurs, rubs, or gallops. Respiratory: Normal respiratory effort without tachypnea nor retractions. Breath sounds are clear and equal bilaterally. No wheezes/rales/rhonchi. Gastrointestinal: Bilateral upper quadrant tenderness, mild.. No distention. There is no CVA tenderness.  No rebound, rigidity, or guarding. Genitourinary: deferred Musculoskeletal: Nontender with normal range of motion in all extremities. No joint effusions.  No lower extremity tenderness.  No edema. Neurologic:   Normal speech and language.  CN 2-10 normal. Motor grossly intact. No pronator drift.  Normal gait. No gross focal neurologic deficits are appreciated.  Skin:  Skin is warm, dry and intact. No rash noted.  No petechiae, purpura, or bullae. Psychiatric: Mood and affect are normal. Speech and behavior are normal. Patient exhibits appropriate insight and judgment.  ____________________________________________    LABS (pertinent positives/negatives) (all labs ordered are listed, but only abnormal results are displayed) Labs Reviewed  BASIC METABOLIC PANEL - Abnormal; Notable for the following:    Glucose, Bld 211 (*)    BUN 27 (*)    All other components within normal limits  HEPATIC FUNCTION PANEL - Abnormal; Notable for the following:    Albumin 3.3 (*)    Bilirubin, Direct <0.1 (*)    All other components within normal limits  LIPASE, BLOOD - Abnormal; Notable for the following:    Lipase 66 (*)    All other components within normal limits  CBC WITH DIFFERENTIAL/PLATELET - Abnormal; Notable for the following:    RBC 3.15 (*)    Hemoglobin 9.3 (*)    HCT 28.3 (*)    RDW 15.8 (*)    Platelets 90 (*)    All other components within normal limits  URINALYSIS COMPLETEWITH MICROSCOPIC ([facility] ONLY) -  Abnormal; Notable for the following:    Color, Urine YELLOW (*)    APPearance CLEAR (*)    Glucose, UA >500 (*)    Squamous Epithelial / LPF 0-5 (*)    All other components within normal limits  GLUCOSE, CAPILLARY  CBG MONITORING, ED   ____________________________________________   EKG    ____________________________________________    RADIOLOGY    ____________________________________________   PROCEDURES  ____________________________________________   INITIAL IMPRESSION / ASSESSMENT AND PLAN / ED COURSE  Pertinent labs & imaging results that were available during my care of the patient were reviewed by me and considered in my medical decision making (see chart for details).  Low suspicion of sepsis, stroke, intracranial hemorrhage or thrombosis. Her sensation is consistent with glycemic instability related to a new medication change. As a response to some episodic hypoglycemia, the patient is now adding sugar to her diet and therefore causing large swings in her blood sugar which are likely affecting her energy level and causing strange sensations. We'll give her IV fluids for her blood sugar of 340 today and check some labs. ----------------------------------------- 2:22 PM on 10/07/2014 -----------------------------------------  Repeat blood sugar 96. Patient feeling much better and back to normal. Labs unremarkable. No ketosis or acidosis. We'll discharge home to follow up with her primary care doctor. Due to episodes of hypoglycemia, although advised patient to be sure she eats regular meals and to avoid using added sugar in her regular foods. She should still use rapidly absorbed sugars when needed for acute hypoglycemia.Marland Kitchen  ----------------------------------------- 2:54 PM on 10/07/2014 -----------------------------------------  Labs unremarkable except for  mildly elevated lipase. LFTs are normal save low suspicion of choledocholithiasis or cholangitis. She may  have a mild pancreatitis but not clinically significant TO the course currently. Patient is well-appearing no acute distress back to baseline. We'll discharge her home and have her follow up with primary care ____________________________________________   FINAL CLINICAL IMPRESSION(S) / ED DIAGNOSES  Final diagnoses:  Hyperglycemia      Sharman Cheek, MD 10/07/14 1455

## 2014-11-03 ENCOUNTER — Emergency Department: Payer: Medicaid Other

## 2014-11-03 ENCOUNTER — Observation Stay
Admit: 2014-11-03 | Discharge: 2014-11-03 | Disposition: A | Discharge status: Home / Self Care | Payer: Medicaid Other | Attending: Internal Medicine | Admitting: Internal Medicine

## 2014-11-03 ENCOUNTER — Inpatient Hospital Stay
Admission: EM | Admit: 2014-11-03 | Discharge: 2014-11-06 | DRG: 314 | Disposition: A | Discharge status: Home / Self Care | Payer: Medicaid Other | Attending: Internal Medicine | Admitting: Internal Medicine

## 2014-11-03 ENCOUNTER — Encounter: Payer: Self-pay | Admitting: Emergency Medicine

## 2014-11-03 DIAGNOSIS — I248 Other forms of acute ischemic heart disease: Secondary | ICD-10-CM | POA: Diagnosis present

## 2014-11-03 DIAGNOSIS — R778 Other specified abnormalities of plasma proteins: Secondary | ICD-10-CM

## 2014-11-03 DIAGNOSIS — E11649 Type 2 diabetes mellitus with hypoglycemia without coma: Secondary | ICD-10-CM | POA: Diagnosis present

## 2014-11-03 DIAGNOSIS — F329 Major depressive disorder, single episode, unspecified: Secondary | ICD-10-CM | POA: Diagnosis present

## 2014-11-03 DIAGNOSIS — E785 Hyperlipidemia, unspecified: Secondary | ICD-10-CM | POA: Diagnosis present

## 2014-11-03 DIAGNOSIS — E860 Dehydration: Secondary | ICD-10-CM | POA: Diagnosis present

## 2014-11-03 DIAGNOSIS — R7989 Other specified abnormal findings of blood chemistry: Secondary | ICD-10-CM

## 2014-11-03 DIAGNOSIS — Z8249 Family history of ischemic heart disease and other diseases of the circulatory system: Secondary | ICD-10-CM

## 2014-11-03 DIAGNOSIS — R42 Dizziness and giddiness: Secondary | ICD-10-CM

## 2014-11-03 DIAGNOSIS — I509 Heart failure, unspecified: Secondary | ICD-10-CM | POA: Diagnosis present

## 2014-11-03 DIAGNOSIS — E1140 Type 2 diabetes mellitus with diabetic neuropathy, unspecified: Secondary | ICD-10-CM | POA: Diagnosis present

## 2014-11-03 DIAGNOSIS — E876 Hypokalemia: Secondary | ICD-10-CM | POA: Diagnosis present

## 2014-11-03 DIAGNOSIS — R55 Syncope and collapse: Secondary | ICD-10-CM | POA: Diagnosis present

## 2014-11-03 DIAGNOSIS — Z87891 Personal history of nicotine dependence: Secondary | ICD-10-CM

## 2014-11-03 DIAGNOSIS — I10 Essential (primary) hypertension: Secondary | ICD-10-CM | POA: Diagnosis present

## 2014-11-03 DIAGNOSIS — M549 Dorsalgia, unspecified: Secondary | ICD-10-CM | POA: Diagnosis present

## 2014-11-03 DIAGNOSIS — I618 Other nontraumatic intracerebral hemorrhage: Secondary | ICD-10-CM | POA: Diagnosis present

## 2014-11-03 DIAGNOSIS — I611 Nontraumatic intracerebral hemorrhage in hemisphere, cortical: Secondary | ICD-10-CM | POA: Diagnosis present

## 2014-11-03 DIAGNOSIS — K219 Gastro-esophageal reflux disease without esophagitis: Secondary | ICD-10-CM | POA: Diagnosis present

## 2014-11-03 DIAGNOSIS — Y92009 Unspecified place in unspecified non-institutional (private) residence as the place of occurrence of the external cause: Secondary | ICD-10-CM

## 2014-11-03 DIAGNOSIS — T447X5A Adverse effect of beta-adrenoreceptor antagonists, initial encounter: Secondary | ICD-10-CM | POA: Diagnosis present

## 2014-11-03 DIAGNOSIS — I959 Hypotension, unspecified: Principal | ICD-10-CM | POA: Diagnosis present

## 2014-11-03 DIAGNOSIS — M1990 Unspecified osteoarthritis, unspecified site: Secondary | ICD-10-CM | POA: Diagnosis present

## 2014-11-03 DIAGNOSIS — H6690 Otitis media, unspecified, unspecified ear: Secondary | ICD-10-CM | POA: Diagnosis present

## 2014-11-03 DIAGNOSIS — D649 Anemia, unspecified: Secondary | ICD-10-CM | POA: Diagnosis present

## 2014-11-03 LAB — CBC WITH DIFFERENTIAL/PLATELET
Basophils Absolute: 0 10*3/uL (ref 0–0.1)
Basophils Relative: 0 %
Eosinophils Absolute: 0 10*3/uL (ref 0–0.7)
Eosinophils Relative: 0 %
HCT: 32.4 % — ABNORMAL LOW (ref 35.0–47.0)
Hemoglobin: 10.6 g/dL — ABNORMAL LOW (ref 12.0–16.0)
LYMPHS PCT: 24 %
Lymphs Abs: 1.7 10*3/uL (ref 1.0–3.6)
MCH: 29 pg (ref 26.0–34.0)
MCHC: 32.8 g/dL (ref 32.0–36.0)
MCV: 88.4 fL (ref 80.0–100.0)
MONO ABS: 0.5 10*3/uL (ref 0.2–0.9)
Monocytes Relative: 7 %
Neutro Abs: 4.9 10*3/uL (ref 1.4–6.5)
Neutrophils Relative %: 69 %
PLATELETS: 134 10*3/uL — AB (ref 150–440)
RBC: 3.67 MIL/uL — ABNORMAL LOW (ref 3.80–5.20)
RDW: 14.7 % — AB (ref 11.5–14.5)
WBC: 7.2 10*3/uL (ref 3.6–11.0)

## 2014-11-03 LAB — HEMOGLOBIN A1C: HEMOGLOBIN A1C: 6.4 % — AB (ref 4.0–6.0)

## 2014-11-03 LAB — COMPREHENSIVE METABOLIC PANEL
ALBUMIN: 3.1 g/dL — AB (ref 3.5–5.0)
ALT: 40 U/L (ref 14–54)
AST: 57 U/L — ABNORMAL HIGH (ref 15–41)
Alkaline Phosphatase: 82 U/L (ref 38–126)
Anion gap: 11 (ref 5–15)
BUN: 21 mg/dL — ABNORMAL HIGH (ref 6–20)
CO2: 24 mmol/L (ref 22–32)
CREATININE: 1.4 mg/dL — AB (ref 0.44–1.00)
Calcium: 8.6 mg/dL — ABNORMAL LOW (ref 8.9–10.3)
Chloride: 104 mmol/L (ref 101–111)
GFR calc Af Amer: 48 mL/min — ABNORMAL LOW (ref >60)
GFR calc non Af Amer: 41 mL/min — ABNORMAL LOW (ref >60)
Glucose, Bld: 287 mg/dL — ABNORMAL HIGH (ref 65–99)
Potassium: 3 mmol/L — ABNORMAL LOW (ref 3.5–5.1)
Sodium: 139 mmol/L (ref 135–145)
TOTAL PROTEIN: 7 g/dL (ref 6.5–8.1)
Total Bilirubin: 0.3 mg/dL (ref 0.3–1.2)

## 2014-11-03 LAB — GLUCOSE, CAPILLARY
GLUCOSE-CAPILLARY: 172 mg/dL — AB (ref 65–99)
Glucose-Capillary: 233 mg/dL — ABNORMAL HIGH (ref 65–99)
Glucose-Capillary: 155 mg/dL — ABNORMAL HIGH (ref 65–99)

## 2014-11-03 LAB — URINALYSIS COMPLETE WITH MICROSCOPIC (ARMC ONLY)
Bilirubin Urine: NEGATIVE
Glucose, UA: 50 mg/dL — AB
Hgb urine dipstick: NEGATIVE
KETONES UR: NEGATIVE mg/dL
LEUKOCYTES UA: NEGATIVE
NITRITE: NEGATIVE
PROTEIN: 100 mg/dL — AB
Specific Gravity, Urine: 1.021 (ref 1.005–1.030)
Trans Epithel, UA: 1
pH: 5 (ref 5.0–8.0)

## 2014-11-03 LAB — TROPONIN I
TROPONIN I: 0.13 ng/mL — AB (ref <0.031)
Troponin I: 0.16 ng/mL — ABNORMAL HIGH (ref <0.031)
Troponin I: 0.11 ng/mL — ABNORMAL HIGH (ref <0.031)

## 2014-11-03 LAB — CREATININE, SERUM
CREATININE: 1.41 mg/dL — AB (ref 0.44–1.00)
GFR calc Af Amer: 47 mL/min — ABNORMAL LOW (ref >60)
GFR, EST NON AFRICAN AMERICAN: 41 mL/min — AB (ref >60)

## 2014-11-03 LAB — CBC
HCT: 33.3 % — ABNORMAL LOW (ref 35.0–47.0)
Hemoglobin: 11.1 g/dL — ABNORMAL LOW (ref 12.0–16.0)
MCH: 29.4 pg (ref 26.0–34.0)
MCHC: 33.2 g/dL (ref 32.0–36.0)
MCV: 88.5 fL (ref 80.0–100.0)
Platelets: 142 10*3/uL — ABNORMAL LOW (ref 150–440)
RBC: 3.76 MIL/uL — ABNORMAL LOW (ref 3.80–5.20)
RDW: 14.8 % — AB (ref 11.5–14.5)
WBC: 8.1 10*3/uL (ref 3.6–11.0)

## 2014-11-03 LAB — MAGNESIUM: MAGNESIUM: 2 mg/dL (ref 1.7–2.4)

## 2014-11-03 LAB — AMMONIA: Ammonia: 26 umol/L (ref 9–35)

## 2014-11-03 MED ORDER — ENOXAPARIN SODIUM 100 MG/ML ~~LOC~~ SOLN
SUBCUTANEOUS | Status: AC
  Administered 2014-11-03: 40 mg via SUBCUTANEOUS
  Filled 2014-11-03: qty 1

## 2014-11-03 MED ORDER — INSULIN ASPART 100 UNIT/ML ~~LOC~~ SOLN: 0.0000 [IU] | Freq: Every day | SUBCUTANEOUS | Status: DC

## 2014-11-03 MED ORDER — POTASSIUM CHLORIDE 20 MEQ PO PACK
PACK | ORAL | Status: AC
  Administered 2014-11-03: 40 meq via ORAL
  Filled 2014-11-03: qty 2

## 2014-11-03 MED ORDER — CHLORHEXIDINE GLUCONATE 0.12 % MT SOLN: 15.0000 mL | Freq: Two times a day (BID) | OROMUCOSAL | Status: DC

## 2014-11-03 MED ORDER — ACETAMINOPHEN 650 MG RE SUPP
650.0000 mg | Freq: Four times a day (QID) | RECTAL | Status: DC | PRN
Start: 2014-11-03 — End: 2014-11-06

## 2014-11-03 MED ORDER — POTASSIUM CHLORIDE IN NACL 20-0.9 MEQ/L-% IV SOLN
INTRAVENOUS | Status: DC
  Administered 2014-11-03 – 2014-11-04 (×2): via INTRAVENOUS
  Filled 2014-11-03 (×5): qty 1000

## 2014-11-03 MED ORDER — PANTOPRAZOLE SODIUM 40 MG PO TBEC
40.0000 mg | DELAYED_RELEASE_TABLET | Freq: Every day | ORAL | Status: DC
  Administered 2014-11-03 – 2014-11-06 (×4): 40 mg via ORAL
  Filled 2014-11-03 (×4): qty 1

## 2014-11-03 MED ORDER — ATORVASTATIN CALCIUM 20 MG PO TABS
40.0000 mg | ORAL_TABLET | Freq: Every day | ORAL | Status: DC
  Administered 2014-11-03 – 2014-11-06 (×4): 40 mg via ORAL
  Filled 2014-11-03 (×4): qty 2

## 2014-11-03 MED ORDER — FERROUS SULFATE 325 (65 FE) MG PO TABS
325.0000 mg | ORAL_TABLET | Freq: Every day | ORAL | Status: DC
  Administered 2014-11-04 – 2014-11-06 (×3): 325 mg via ORAL
  Filled 2014-11-03 (×3): qty 1

## 2014-11-03 MED ORDER — ASPIRIN EC 81 MG PO TBEC
81.0000 mg | DELAYED_RELEASE_TABLET | Freq: Every day | ORAL | Status: DC
  Administered 2014-11-03 – 2014-11-06 (×4): 81 mg via ORAL
  Filled 2014-11-03 (×3): qty 1

## 2014-11-03 MED ORDER — AMOXICILLIN-POT CLAVULANATE 875-125 MG PO TABS
1.0000 | ORAL_TABLET | Freq: Two times a day (BID) | ORAL | Status: DC
  Administered 2014-11-03 – 2014-11-05 (×6): 1 via ORAL
  Filled 2014-11-03 (×6): qty 1

## 2014-11-03 MED ORDER — SODIUM CHLORIDE 0.9 % IJ SOLN
3.0000 mL | Freq: Two times a day (BID) | INTRAMUSCULAR | Status: DC
  Administered 2014-11-03 – 2014-11-06 (×6): 3 mL via INTRAVENOUS

## 2014-11-03 MED ORDER — POTASSIUM CHLORIDE 20 MEQ PO PACK
40.0000 meq | PACK | Freq: Once | ORAL | Status: AC
  Administered 2014-11-03: 40 meq via ORAL

## 2014-11-03 MED ORDER — NYSTATIN 100000 UNIT/GM EX CREA
1.0000 "application " | TOPICAL_CREAM | Freq: Two times a day (BID) | CUTANEOUS | Status: DC
  Administered 2014-11-03 – 2014-11-06 (×6): 1 via TOPICAL
  Filled 2014-11-03: qty 15

## 2014-11-03 MED ORDER — ENOXAPARIN SODIUM 40 MG/0.4ML ~~LOC~~ SOLN
40.0000 mg | SUBCUTANEOUS | Status: DC
  Administered 2014-11-03 – 2014-11-04 (×2): 40 mg via SUBCUTANEOUS
  Filled 2014-11-03: qty 0.4

## 2014-11-03 MED ORDER — INSULIN ASPART 100 UNIT/ML ~~LOC~~ SOLN
0.0000 [IU] | Freq: Three times a day (TID) | SUBCUTANEOUS | Status: DC
  Administered 2014-11-03 – 2014-11-06 (×7): 2 [IU] via SUBCUTANEOUS
  Filled 2014-11-03 (×7): qty 2

## 2014-11-03 MED ORDER — POTASSIUM CHLORIDE IN NACL 20-0.9 MEQ/L-% IV SOLN
INTRAVENOUS | Status: AC
  Filled 2014-11-03: qty 1000

## 2014-11-03 MED ORDER — ASPIRIN EC 81 MG PO TBEC
DELAYED_RELEASE_TABLET | ORAL | Status: AC
  Administered 2014-11-03: 81 mg via ORAL
  Filled 2014-11-03: qty 1

## 2014-11-03 MED ORDER — ACETAMINOPHEN 325 MG PO TABS
650.0000 mg | ORAL_TABLET | Freq: Four times a day (QID) | ORAL | Status: DC | PRN
  Administered 2014-11-05: 650 mg via ORAL
  Filled 2014-11-03: qty 2

## 2014-11-03 MED ORDER — AMOXICILLIN-POT CLAVULANATE 875-125 MG PO TABS
ORAL_TABLET | ORAL | Status: AC
  Administered 2014-11-03: 1 via ORAL
  Filled 2014-11-03: qty 1

## 2014-11-03 MED ORDER — GABAPENTIN 400 MG PO CAPS
800.0000 mg | ORAL_CAPSULE | Freq: Three times a day (TID) | ORAL | Status: DC
  Administered 2014-11-03 – 2014-11-06 (×9): 800 mg via ORAL
  Filled 2014-11-03 (×9): qty 2

## 2014-11-03 MED ORDER — SERTRALINE HCL 50 MG PO TABS
150.0000 mg | ORAL_TABLET | Freq: Every day | ORAL | Status: DC
  Administered 2014-11-04 – 2014-11-06 (×3): 150 mg via ORAL
  Filled 2014-11-03 (×3): qty 3

## 2014-11-03 MED ORDER — CHLORHEXIDINE GLUCONATE 0.12 % MT SOLN
15.0000 mL | Freq: Two times a day (BID) | OROMUCOSAL | Status: DC
  Administered 2014-11-04 – 2014-11-06 (×5): 15 mL via OROMUCOSAL
  Filled 2014-11-03: qty 15

## 2014-11-03 NOTE — Consult Note (Signed)
[facility] Cardiology  CARDIOLOGY CONSULT NOTE  Patient ID: Guy Toney MRN: [id number] DOB/AGE: 08/30/1955 59 y.o.  Admit date: 11/03/2014 Referring Physician [place] Primary Physician Louretta Parma NP Primary Cardiologist Dorothyann Peng M.D. Reason for Consultation elevated troponin  HPI: 59 year old female referred for evaluation of elevated troponin. The patient has a history of hypertension on multiple BP medications. The patient had 6 teeth removed the last week and has had much decreased intake. Patient also says recent nausea and vomiting. The patient presented to [facility] emergency room with lightheadedness and weakness. She was noted to be hypotensive. Upon was borderline elevated 0.13 of chest pain or ECG changes. The patient was treated with intravenous fluid resuscitation with overall clinical improvement and normalization of blood pressure.  Review of systems complete and found to be negative unless listed above     Past Medical History  Diagnosis Date  . Hypertension   . Diabetes mellitus without complication   . Anemia   . GERD (gastroesophageal reflux disease)   . Arthritis   . Depression   . Hepatitis C   . Hypokalemia   . Diverticulitis   . Bronchitis   . Renal disorder   . Back pain   . CHF (congestive heart failure)     Past Surgical History  Procedure Laterality Date  . Cesarean section    . Abdominal hysterectomy    . Small intestine surgery N/A     Complication of hysterectomy    Prescriptions prior to admission  Medication Sig Dispense Refill Last Dose  . albuterol (PROVENTIL HFA;VENTOLIN HFA) 108 (90 BASE) MCG/ACT inhaler Inhale 2 puffs into the lungs every 6 (six) hours as needed for wheezing.    unknown  . amLODipine (NORVASC) 10 MG tablet Take 10 mg by mouth daily.   11/02/2014 at am  . aspirin EC 81 MG tablet Take 81 mg by mouth daily.   11/02/2014 at am  . atorvastatin (LIPITOR) 40 MG tablet Take 40 mg by mouth daily.   11/02/2014 at pm  . carvedilol  (COREG) 25 MG tablet Take 25 mg by mouth 2 (two) times daily with a meal.   11/02/2014 at pm  . cloNIDine (CATAPRES) 0.1 MG tablet Take 0.1 mg by mouth 2 (two) times daily.    11/02/2014 at pm  . ferrous sulfate 325 (65 FE) MG tablet Take 325 mg by mouth daily with breakfast.   11/02/2014 at am  . gabapentin (NEURONTIN) 400 MG capsule Take 800 mg by mouth 3 (three) times daily.   11/02/2014 at pm  . hydrALAZINE (APRESOLINE) 100 MG tablet Take 100 mg by mouth 3 (three) times daily.   11/02/2014 at pm  . lisinopril (PRINIVIL,ZESTRIL) 40 MG tablet Take 40 mg by mouth daily.   11/02/2014 at am  . metFORMIN (GLUCOPHAGE) 1000 MG tablet Take 1,000 mg by mouth 2 (two) times daily with a meal.   11/02/2014 at pm  . nystatin cream (MYCOSTATIN) Apply 1 application topically 2 (two) times daily.   11/02/2014 at pm  . omeprazole (PRILOSEC) 20 MG capsule Take 20 mg by mouth 2 (two) times daily before a meal.   11/02/2014 at pm  . potassium chloride SA (K-DUR,KLOR-CON) 20 MEQ tablet Take 20 mEq by mouth 2 (two) times daily.   11/02/2014 at pm  . sertraline (ZOLOFT) 100 MG tablet Take 150 mg by mouth daily.   11/02/2014 at am  . triamterene-hydrochlorothiazide (DYAZIDE) 37.5-25 MG per capsule Take 1 capsule by mouth every morning.   11/02/2014  at am   History   Social History  . Marital Status: Legally Separated    Spouse Name: N/A  . Number of Children: N/A  . Years of Education: N/A   Occupational History  . Not on file.   Social History Main Topics  . Smoking status: Former Smoker    Quit date: 10/06/2009  . Smokeless tobacco: Not on file  . Alcohol Use: No  . Drug Use: No  . Sexual Activity: Not on file   Other Topics Concern  . Not on file   Social History Narrative    Family History  Problem Relation Age of Onset  . Cancer Mother   . Coronary artery disease Father       Review of systems complete and found to be negative unless listed above      PHYSICAL EXAM  General: Well developed, well  nourished, in no acute distress HEENT:  Normocephalic and atramatic Neck:  No JVD.  Lungs: Clear bilaterally to auscultation and percussion. Heart: HRRR . Normal S1 and S2 without gallops or murmurs.  Abdomen: Bowel sounds are positive, abdomen soft and non-tender  Msk:  Back normal, normal gait. Normal strength and tone for age. Extremities: No clubbing, cyanosis or edema.   Neuro: Alert and oriented X 3. Psych:  Good affect, responds appropriately  Labs:   Lab Results  Component Value Date   WBC 8.1 11/03/2014   HGB 11.1* 11/03/2014   HCT 33.3* 11/03/2014   MCV 88.5 11/03/2014   PLT 142* 11/03/2014    Recent Labs Lab 11/03/14 0913 11/03/14 1311  NA 139  --   K 3.0*  --   CL 104  --   CO2 24  --   BUN 21*  --   CREATININE 1.40* 1.41*  CALCIUM 8.6*  --   PROT 7.0  --   BILITOT 0.3  --   ALKPHOS 82  --   ALT 40  --   AST 57*  --   GLUCOSE 287*  --    Lab Results  Component Value Date   TROPONINI 0.13* 11/03/2014   No results found for: CHOL No results found for: HDL No results found for: LDLCALC No results found for: TRIG No results found for: CHOLHDL No results found for: LDLDIRECT    Radiology: Ct Head Wo Contrast  11/03/2014   CLINICAL DATA:  Decreased level of consciousness, somnolent, history hypertension, diabetes mellitus, hepatitis-C, CHF, GERD, anemia  EXAM: CT HEAD WITHOUT CONTRAST  TECHNIQUE: Contiguous axial images were obtained from the base of the skull through the vertex without intravenous contrast.  COMPARISON:  08/12/2014  FINDINGS: Generalized atrophy.  Prominent cisterna magna, normal variant.  No midline shift or mass effect.  Streak artifacts at skull base.  Otherwise normal appearance of brain parenchyma.  No intracranial hemorrhage, mass lesion or evidence acute infarction.  No extra-axial fluid collections.  Bones and sinuses unremarkable.  Area of questionable low-attenuation lateral to the RIGHT caudate head on the previous exam is much less  conspicuous.  IMPRESSION: No acute intracranial abnormalities.   Electronically Signed   By: Ulyses Southward M.D.   On: 11/03/2014 10:05   Dg Chest Portable 1 View  11/03/2014   CLINICAL DATA:  Decreased level of consciousness today.  Weakness.  EXAM: PORTABLE CHEST - 1 VIEW  COMPARISON:  Single view of the chest 08/12/2014.  FINDINGS: Heart size is enlarged but there is no pulmonary edema. The lungs are clear. No pneumothorax or pleural  fluid.  IMPRESSION: Cardiomegaly without acute disease.   Electronically Signed   By: Drusilla Kannerhomas  Dalessio M.D.   On: 11/03/2014 09:33    EKG: Sinus rhythm  ASSESSMENT AND PLAN:   59 year old female who presents with hypotension, following recent removal of 6 teeth and much decreased po intake consequent dehydration. Troponin was borderline elevated without peak trough, without chest pain or new ECG changes. The troponin likely due to demand supply ischemia and not due to acute coronary syndrome.  Recommendations  1. Continue current medications 2. Continue IV hydration 3. Defer full dose anticoagulation 4. Defer further cardiac diagnostics at this time 5. Resume prior BP medications as needed following IV hydration and normalization of BP  Signed: Muaad Boehning MD,PhD, [facility] 11/03/2014, 5:35 PM

## 2014-11-03 NOTE — ED Notes (Signed)
Per lab, troponin .16, potassium 3.0

## 2014-11-03 NOTE — ED Notes (Signed)
Pt now awake - talking on cell phone. Seem to be gcs 15

## 2014-11-03 NOTE — ED Provider Notes (Signed)
[facility] Emergency Department Provider Note  ____________________________________________  Time seen: On arrival  I have reviewed the triage vital signs and the nursing notes.   HISTORY  Chief Complaint Altered Mental Status    HPI Jamie Austin is a 59 y.o. female who was sent from her PCPs office for hypoglycemia and decreased level of consciousness apparently. Patient presents with eyes closed but will answer questions appropriately when asked. Review of records and demonstrates a history of similar instances of decreased level of consciousness. Blood sugar checked and stable.     Past Medical History  Diagnosis Date  . Hypertension   . Diabetes mellitus without complication   . Anemia   . GERD (gastroesophageal reflux disease)   . Arthritis   . Depression   . Hepatitis C   . Hypokalemia   . Diverticulitis   . Bronchitis   . Renal disorder   . Back pain   . CHF (congestive heart failure)     There are no active problems to display for this patient.   Past Surgical History  Procedure Laterality Date  . Cesarean section    . Abdominal hysterectomy      Current Outpatient Rx  Name  Route  Sig  Dispense  Refill  . acetaminophen (TYLENOL) 500 MG tablet   Oral   Take 500-1,000 mg by mouth every 6 (six) hours as needed.         Marland Kitchen albuterol (PROVENTIL HFA;VENTOLIN HFA) 108 (90 BASE) MCG/ACT inhaler   Inhalation   Inhale 1-2 puffs into the lungs every 6 (six) hours as needed for wheezing or shortness of breath.         Marland Kitchen aspirin EC 81 MG tablet   Oral   Take 81 mg by mouth daily.         Marland Kitchen atorvastatin (LIPITOR) 40 MG tablet   Oral   Take 40 mg by mouth daily.         . budesonide-formoterol (SYMBICORT) 80-4.5 MCG/ACT inhaler   Inhalation   Inhale 2 puffs into the lungs 2 (two) times daily.         . carvedilol (COREG) 12.5 MG tablet   Oral   Take 12.5 mg by mouth 2 (two) times daily with a meal.         .  chlorthalidone (HYGROTON) 50 MG tablet   Oral   Take 50 mg by mouth daily.         . cloNIDine (CATAPRES) 0.1 MG tablet   Oral   Take 0.1 mg by mouth 3 (three) times daily.         . fluticasone (FLONASE) 50 MCG/ACT nasal spray   Each Nare   Place 2 sprays into both nostrils daily.         Marland Kitchen gabapentin (NEURONTIN) 300 MG capsule   Oral   Take 300 mg by mouth 3 (three) times daily.         . hydrALAZINE (APRESOLINE) 100 MG tablet   Oral   Take 100 mg by mouth 3 (three) times daily.         Marland Kitchen HYDROcodone-acetaminophen (NORCO/VICODIN) 5-325 MG per tablet   Oral   Take 2 tablets by mouth every 4 (four) hours as needed.   10 tablet   0   . lisinopril (PRINIVIL,ZESTRIL) 40 MG tablet   Oral   Take 40 mg by mouth daily.         . metFORMIN (GLUCOPHAGE) 500 MG  tablet   Oral   Take 500 mg by mouth 2 (two) times daily with a meal.         . naproxen (NAPROSYN) 500 MG tablet   Oral   Take 1 tablet (500 mg total) by mouth 2 (two) times daily with a meal.   30 tablet   0   . omeprazole (PRILOSEC) 20 MG capsule   Oral   Take 20 mg by mouth 2 (two) times daily before a meal.         . potassium chloride (K-DUR) 10 MEQ tablet   Oral   Take 1 tablet (10 mEq total) by mouth daily.   7 tablet   0   . sertraline (ZOLOFT) 100 MG tablet   Oral   Take 150 mg by mouth daily.           Allergies Review of patient's allergies indicates no known allergies.  History reviewed. No pertinent family history.  Social History History  Substance Use Topics  . Smoking status: Former Smoker    Quit date: 10/06/2009  . Smokeless tobacco: Not on file  . Alcohol Use: No    Review of Systems  Constitutional: Negative for fever. Eyes: Negative for visual changes. ENT: Negative for sore throat Cardiovascular: Negative for chest pain. Respiratory: Negative for shortness of breath. Gastrointestinal: Negative for abdominal pain, vomiting and diarrhea. Genitourinary:  Negative for dysuria. Musculoskeletal: Negative for back pain. Skin: Negative for rash. Neurological: Negative for headaches or focal weakness   10-point ROS otherwise negative.  ____________________________________________   PHYSICAL EXAM:  VITAL SIGNS: ED Triage Vitals  Enc Vitals Group     BP 11/03/14 0917 101/65 mmHg     Pulse Rate 11/03/14 0917 73     Resp 11/03/14 0917 17     Temp --      Temp src --      SpO2 11/03/14 0917 96 %     Weight 11/03/14 0917 203 lb (92.08 kg)     Height 11/03/14 0917  (1.651 m)     Head Cir --      Peak Flow --      Pain Score --      Pain Loc --      Pain Edu? --      Excl. in GC? --     Constitutional: Eyes closed, answers questions when asked Eyes: Conjunctivae are normal. PERRLA ENT   Head: Normocephalic and atraumatic.   Mouth/Throat: Mucous membranes are moist. Cardiovascular: Normal rate, regular rhythm. Normal and symmetric distal pulses are present in all extremities. No murmurs, rubs, or gallops. Respiratory: Normal respiratory effort without tachypnea nor retractions. Breath sounds are clear and equal bilaterally.  Gastrointestinal: Soft and non-tender in all quadrants. No distention. There is no CVA tenderness. Genitourinary: deferred Musculoskeletal: Nontender with normal range of motion in all extremities. No lower extremity tenderness nor edema. Neurologic:  Normal speech and language. No gross focal neurologic deficits are appreciated. Skin:  Skin is warm, dry and intact. No rash noted.   ____________________________________________    LABS (pertinent positives/negatives)  Labs Reviewed  GLUCOSE, CAPILLARY - Abnormal; Notable for the following:    Glucose-Capillary 233 (*)    All other components within normal limits  CBC WITH DIFFERENTIAL/PLATELET  COMPREHENSIVE METABOLIC PANEL  AMMONIA  TROPONIN I  URINALYSIS COMPLETEWITH MICROSCOPIC ([facility] ONLY)  CBG MONITORING, ED     ____________________________________________   EKG  ED ECG REPORT I, Jene Every, the attending physician, personally  viewed and interpreted this ECG.  Date: 11/03/2014 EKG Time: 9:15 AM Rate: 66 Rhythm: normal sinus rhythm QRS Axis: normal Intervals: normal ST/T Wave abnormalities: normal Conduction Disutrbances: none Narrative Interpretation: unremarkable   ____________________________________________    RADIOLOGY I have personally reviewed any xrays that were ordered on this patient: Chest x-ray is unremarkable  CT head unremarkable  ____________________________________________   PROCEDURES  Procedure(s) performed: none  Critical Care performed: yes CRITICAL CARE Performed by: Jene EveryKINNER, Doria Fern   Total critical care time: 30  Critical care time was exclusive of separately billable procedures and treating other patients.  Critical care was necessary to treat or prevent imminent or life-threatening deterioration.  Critical care was time spent personally by me on the following activities: development of treatment plan with patient and/or surrogate as well as nursing, discussions with consultants, evaluation of patient's response to treatment, examination of patient, obtaining history from patient or surrogate, ordering and performing treatments and interventions, ordering and review of laboratory studies, ordering and review of radiographic studies, pulse oximetry and re-evaluation of patient's condition.   ____________________________________________   INITIAL IMPRESSION / ASSESSMENT AND PLAN / ED COURSE  Pertinent labs & imaging results that were available during my care of the patient were reviewed by me and considered in my medical decision making (see chart for details).  She initially seemed unresponsive to EMS but gradually started to respond more and more. She will answer questions appropriately if I continually ask her. She seems to eventually  get frustrated and answer me.  Nurse tells me that she is speaking on the telephone now. Given unclear cause of LOC we will obtain blood tests, check blood sugars, check urine chest x-ray and a CT head  ----------------------------------------- 10:59 AM on 11/03/2014 -----------------------------------------  Patient with elevated troponin near syncopal episode EKG is unremarkable and patient has not had any chest pain however I will admit the patient for further workup given elevated cardiac enzymes ____________________________________________   FINAL CLINICAL IMPRESSION(S) / ED DIAGNOSES  Final diagnoses:  Syncope, near     Jene Everyobert Paige Vanderwoude, MD 11/03/14 1059

## 2014-11-03 NOTE — ED Notes (Signed)
Ems from [facility] urgent care. Pt with decreased level of consciousness. Very sleepy. Will arouse briefly when stimulated. No obvious cause

## 2014-11-03 NOTE — H&P (Signed)
[facility] [place] [facility]   PATIENT NAME: Jamie Austin    MR#:  [id number]  DATE OF BIRTH:  [birth year]  DATE OF ADMISSION:  11/03/2014  PRIMARY CARE PHYSICIAN: WHITE, Arlyss Repress, NP   REQUESTING/REFERRING PHYSICIAN: Jene Every, M.D.  CHIEF COMPLAINT:   Chief Complaint  Patient presents with  . Altered Mental Status    HISTORY OF PRESENT ILLNESS:  Jamie Austin  is a 59 y.o. female with a known history of hypertension, diabetes, gastroesophageal reflux disease, hyperlipidemia, anemia. She was going for a blood pressure check today and felt that she could hardly walk. She felt like she was going to pass out. She felt weak and hot and clammy and the room was spinning to the right. Now her head feels weird and she did also feel like she was spinning when she got up to go the bathroom here in the ER. On Thursday and Friday she had some nausea vomiting. Of note she had 6 teeth pulled last Wednesday on the top. Her medical doctor is been playing around with the dose of her clonidine. In the emergency room she was found to have a borderline troponin and hypokalemia and her blood pressure was on the lower side. Hospitalist services were contacted for further evaluation.  PAST MEDICAL HISTORY:   Past Medical History  Diagnosis Date  . Hypertension   . Diabetes mellitus without complication   . Anemia   . GERD (gastroesophageal reflux disease)   . Arthritis   . Depression   . Hepatitis C   . Hypokalemia   . Diverticulitis   . Bronchitis   . Renal disorder   . Back pain   . CHF (congestive heart failure)     PAST SURGICAL HISTORY:   Past Surgical History  Procedure Laterality Date  . Cesarean section    . Abdominal hysterectomy    . Small intestine surgery N/A     Complication of hysterectomy    SOCIAL HISTORY:   History  Substance Use Topics  . Smoking status: Former Smoker    Quit date: 10/06/2009  . Smokeless tobacco: Not on  file  . Alcohol Use: No    FAMILY HISTORY:   Family History  Problem Relation Age of Onset  . Cancer Mother   . Coronary artery disease Father     DRUG ALLERGIES:  No Known Allergies  REVIEW OF SYSTEMS:  CONSTITUTIONAL: Questionable fever, positive for clammy feeling, decreased appetite, positive for fatigue and weakness.  EYES: No blurred or double vision. Wears glasses. EARS, NOSE, AND THROAT: No tinnitus or ear pain. No sore throat. Positive for runny nose. RESPIRATORY: No cough, occasional shortness of breath, no wheezing or hemoptysis.  CARDIOVASCULAR: No chest pain, orthopnea, edema.  GASTROINTESTINAL: On Thursday and Friday had nausea and vomiting, no abdominal pain. No blood in bowel movements. Positive for constipation. GENITOURINARY: No dysuria, hematuria.  ENDOCRINE: No polyuria, nocturia,  HEMATOLOGY: Positive for anemia, no easy bruising or bleeding. SKIN: No rash or lesion. MUSCULOSKELETAL: No joint pain or arthritis.   NEUROLOGIC: No tingling, numbness, weakness. Felt faint. PSYCHIATRY: No anxiety, positive for depression.   MEDICATIONS AT HOME:   Prior to Admission medications   Medication Sig Start Date End Date Taking? Authorizing Provider  albuterol (PROVENTIL HFA;VENTOLIN HFA) 108 (90 BASE) MCG/ACT inhaler Inhale 2 puffs into the lungs every 6 (six) hours as needed for wheezing.    Yes Historical Provider, MD  amLODipine (NORVASC) 10 MG tablet Take 10  mg by mouth daily.   Yes Historical Provider, MD  aspirin EC 81 MG tablet Take 81 mg by mouth daily.   Yes Historical Provider, MD  atorvastatin (LIPITOR) 40 MG tablet Take 40 mg by mouth daily.   Yes Historical Provider, MD  carvedilol (COREG) 25 MG tablet Take 25 mg by mouth 2 (two) times daily with a meal.   Yes Historical Provider, MD  cloNIDine (CATAPRES) 0.1 MG tablet Take 0.1 mg by mouth 2 (two) times daily.    Yes Historical Provider, MD  ferrous sulfate 325 (65 FE) MG tablet Take 325 mg by mouth daily  with breakfast.   Yes Historical Provider, MD  gabapentin (NEURONTIN) 400 MG capsule Take 800 mg by mouth 3 (three) times daily.   Yes Historical Provider, MD  hydrALAZINE (APRESOLINE) 100 MG tablet Take 100 mg by mouth 3 (three) times daily.   Yes Historical Provider, MD  lisinopril (PRINIVIL,ZESTRIL) 40 MG tablet Take 40 mg by mouth daily.   Yes Historical Provider, MD  metFORMIN (GLUCOPHAGE) 1000 MG tablet Take 1,000 mg by mouth 2 (two) times daily with a meal.   Yes Historical Provider, MD  nystatin cream (MYCOSTATIN) Apply 1 application topically 2 (two) times daily.   Yes Historical Provider, MD  omeprazole (PRILOSEC) 20 MG capsule Take 20 mg by mouth 2 (two) times daily before a meal.   Yes Historical Provider, MD  potassium chloride SA (K-DUR,KLOR-CON) 20 MEQ tablet Take 20 mEq by mouth 2 (two) times daily.   Yes Historical Provider, MD  sertraline (ZOLOFT) 100 MG tablet Take 150 mg by mouth daily.   Yes Historical Provider, MD  triamterene-hydrochlorothiazide (DYAZIDE) 37.5-25 MG per capsule Take 1 capsule by mouth every morning.   Yes Historical Provider, MD      VITAL SIGNS:  Blood pressure 116/68, pulse 66, resp. rate 19, height  (1.651 m), weight 92.08 kg (203 lb), SpO2 97 %.  PHYSICAL EXAMINATION:  GENERAL:  59 y.o.-year-old patient lying in the bed with no acute distress.  EYES: Pupils equal, round, reactive to light and accommodation. No scleral icterus. Extraocular muscles intact.  HEENT: Head atraumatic, normocephalic. Oropharynx and nasopharynx clear. Tympanic membrane on the left erythematous and bulging. A brownish area on sites of tooth removal. NECK:  Supple, no jugular venous distention. No thyroid enlargement, no tenderness.  LUNGS: Normal breath sounds bilaterally, no wheezing, rales,rhonchi or crepitation. No use of accessory muscles of respiration.  CARDIOVASCULAR: S1, S2 normal. No murmurs, rubs, or gallops.  ABDOMEN: Soft, nontender, nondistended. Bowel  sounds present. No organomegaly or mass.  EXTREMITIES: 2+ edema, no cyanosis, or clubbing.  NEUROLOGIC: Cranial nerves II through XII are intact. Muscle strength 5/5 in all extremities. Sensation intact. Gait not checked.  PSYCHIATRIC: The patient is alert and oriented x 3.  SKIN: Chronic lower extremity discoloration bilaterally on the shins.  LABORATORY PANEL:   CBC  Recent Labs Lab 11/03/14 0913  WBC 7.2  HGB 10.6*  HCT 32.4*  PLT 134*   Chemistries   Recent Labs Lab 11/03/14 0913  NA 139  K 3.0*  CL 104  CO2 24  GLUCOSE 287*  BUN 21*  CREATININE 1.40*  CALCIUM 8.6*  AST 57*  ALT 40  ALKPHOS 82  BILITOT 0.3   Cardiac Enzymes  Recent Labs Lab 11/03/14 0913  TROPONINI 0.16*    RADIOLOGY:  Ct Head Wo Contrast  11/03/2014   CLINICAL DATA:  Decreased level of consciousness, somnolent, history hypertension, diabetes mellitus, hepatitis-C, CHF,  GERD, anemia  EXAM: CT HEAD WITHOUT CONTRAST  TECHNIQUE: Contiguous axial images were obtained from the base of the skull through the vertex without intravenous contrast.  COMPARISON:  08/12/2014  FINDINGS: Generalized atrophy.  Prominent cisterna magna, normal variant.  No midline shift or mass effect.  Streak artifacts at skull base.  Otherwise normal appearance of brain parenchyma.  No intracranial hemorrhage, mass lesion or evidence acute infarction.  No extra-axial fluid collections.  Bones and sinuses unremarkable.  Area of questionable low-attenuation lateral to the RIGHT caudate head on the previous exam is much less conspicuous.  IMPRESSION: No acute intracranial abnormalities.   Electronically Signed   By: Ulyses SouthwardMark  Boles M.D.   On: 11/03/2014 10:05   Dg Chest Portable 1 View  11/03/2014   CLINICAL DATA:  Decreased level of consciousness today.  Weakness.  EXAM: PORTABLE CHEST - 1 VIEW  COMPARISON:  Single view of the chest 08/12/2014.  FINDINGS: Heart size is enlarged but there is no pulmonary edema. The lungs are clear. No  pneumothorax or pleural fluid.  IMPRESSION: Cardiomegaly without acute disease.   Electronically Signed   By: Drusilla Kannerhomas  Dalessio M.D.   On: 11/03/2014 09:33    EKG:   Normal sinus rhythm with sinus arrhythmia and no acute ST-T wave changes.  IMPRESSION AND PLAN:   1. Near syncope, relative hypotension and dehydration. I will give IV fluid hydration. Hold all 6 blood pressure medications at this point. Check orthostatic vital signs. This could also be a benign positional vertigo with otitis media. 2. Elevated troponin- likely secondary to demand ischemia with no chest pain or shortness of breath will monitor on telemetry and get serial cardiac enzymes. Continue aspirin. Cardiology consultation. Hold off on Coreg with hypotension. 3. Hypokalemia- likely secondary to tube order pills. I will give oral and IV replacement. 4. Otitis media- I will give Augmentin. 5. Hyperlipidemia unspecified continue Lipitor. 6. Chronic anemia continue ferrous sulfate. 7. Diabetes with neuropathy. I will put on sliding scale for right now hold Glucophage and continue the patient's gabapentin. 8. Gastroesophageal reflux disease without esophagitis continue PPI 9. Depression- continue Zoloft.  All the records are reviewed and case discussed with ED provider. Management plans discussed with the patient, family and she is in agreement.  CODE STATUS: Full code  TOTAL TIME TAKING CARE OF THIS PATIENT: 50 minutes.    Alford HighlandWIETING, Ann Groeneveld M.D on 11/03/2014 at 11:59 AM  Between 7am to 6pm - Pager - [phone]  After 6pm call admission pager [phone]  [facility]  Office  [phone]  CC: Primary care physician; WHITE, Arlyss RepressELIZABETH BURNEY, NP

## 2014-11-03 NOTE — Progress Notes (Signed)
Communicated with Dr. Dimas AguasHoward r/t patient medication(clorahexidine) that son brought from home.Dr. Dimas AguasHoward stated that he will put order in.

## 2014-11-03 NOTE — Progress Notes (Signed)
*  PRELIMINARY RESULTS* Echocardiogram 2D Echocardiogram has been performed.  Georgann HousekeeperJerry R Hege 11/03/2014, 1:01 PM

## 2014-11-03 NOTE — ED Notes (Signed)
Pt oob to BR with min assist. Urine sent to lab

## 2014-11-03 NOTE — ED Notes (Signed)
Pt sitting on stretcher eating sandwich box. No acute issues

## 2014-11-03 NOTE — ED Notes (Signed)
fsbs 233

## 2014-11-04 ENCOUNTER — Observation Stay: Payer: Medicaid Other

## 2014-11-04 LAB — CBC
HCT: 28.4 % — ABNORMAL LOW (ref 35.0–47.0)
Hemoglobin: 9.2 g/dL — ABNORMAL LOW (ref 12.0–16.0)
MCH: 28.8 pg (ref 26.0–34.0)
MCHC: 32.3 g/dL (ref 32.0–36.0)
MCV: 89.2 fL (ref 80.0–100.0)
Platelets: 120 10*3/uL — ABNORMAL LOW (ref 150–440)
RBC: 3.18 MIL/uL — ABNORMAL LOW (ref 3.80–5.20)
RDW: 14.7 % — AB (ref 11.5–14.5)
WBC: 7 10*3/uL (ref 3.6–11.0)

## 2014-11-04 LAB — BASIC METABOLIC PANEL
Anion gap: 7 (ref 5–15)
BUN: 27 mg/dL — ABNORMAL HIGH (ref 6–20)
CO2: 24 mmol/L (ref 22–32)
Calcium: 8.3 mg/dL — ABNORMAL LOW (ref 8.9–10.3)
Chloride: 111 mmol/L (ref 101–111)
Creatinine, Ser: 1.25 mg/dL — ABNORMAL HIGH (ref 0.44–1.00)
GFR calc Af Amer: 55 mL/min — ABNORMAL LOW (ref >60)
GFR calc non Af Amer: 47 mL/min — ABNORMAL LOW (ref >60)
GLUCOSE: 178 mg/dL — AB (ref 65–99)
POTASSIUM: 3.6 mmol/L (ref 3.5–5.1)
Sodium: 142 mmol/L (ref 135–145)

## 2014-11-04 LAB — LIPID PANEL
CHOLESTEROL: 115 mg/dL (ref 0–200)
HDL: 36 mg/dL — ABNORMAL LOW (ref >40)
LDL Cholesterol: 58 mg/dL (ref 0–99)
TRIGLYCERIDES: 107 mg/dL (ref <150)
Total CHOL/HDL Ratio: 3.2 RATIO
VLDL: 21 mg/dL (ref 0–40)

## 2014-11-04 LAB — GLUCOSE, CAPILLARY
GLUCOSE-CAPILLARY: 176 mg/dL — AB (ref 65–99)
GLUCOSE-CAPILLARY: 173 mg/dL — AB (ref 65–99)
Glucose-Capillary: 173 mg/dL — ABNORMAL HIGH (ref 65–99)
Glucose-Capillary: 161 mg/dL — ABNORMAL HIGH (ref 65–99)

## 2014-11-04 LAB — TROPONIN I: TROPONIN I: 0.1 ng/mL — AB (ref <0.031)

## 2014-11-04 MED ORDER — CARVEDILOL 25 MG PO TABS
25.0000 mg | ORAL_TABLET | Freq: Two times a day (BID) | ORAL | Status: DC
  Administered 2014-11-04 – 2014-11-06 (×5): 25 mg via ORAL
  Filled 2014-11-04 (×5): qty 1

## 2014-11-04 MED ORDER — METFORMIN HCL 500 MG PO TABS
1000.0000 mg | ORAL_TABLET | Freq: Two times a day (BID) | ORAL | Status: DC
  Administered 2014-11-04 – 2014-11-06 (×4): 1000 mg via ORAL
  Filled 2014-11-04 (×4): qty 2

## 2014-11-04 MED ORDER — AMLODIPINE BESYLATE 10 MG PO TABS
10.0000 mg | ORAL_TABLET | Freq: Every day | ORAL | Status: DC
  Administered 2014-11-04 – 2014-11-06 (×3): 10 mg via ORAL
  Filled 2014-11-04 (×3): qty 1

## 2014-11-04 NOTE — Progress Notes (Signed)
Pt alert and oriented, no complaints of pain.  NSR, lungs clear on room air.  [facility]olerating diet[facility]  Pt up with PT.  VSS, afebrile.  MRI head today, negative for acute event.

## 2014-11-04 NOTE — Evaluation (Signed)
Physical Therapy Evaluation Patient Details Name: Jamie Austin MRN: [id number] DOB: 1956/02/25 Today's Date: 11/04/2014   History of Present Illness  Patient is a 59 y/o female that presents after experiencing severe dizziness while going for a routine blood pressure check. Patient had 6 teeth pulled last week and has not been eating well since. Upon admission she was found to be hypotensive. 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   Clinical Impression  Patient is a 59 y/o female that presents after experiencing dizziness while going for a blood pressure check up. Patient lives with her son at baseline and has been independent with short community distance ambulation, though she does note several falls recently. Today patient appears close to her baseline physically, though she does not feel confident in her balance today (she opts for a RW rather than no AD today). She is rather slow with mobility today, but does not display any loss of balance. Patient would benefit from HHPT to address her balance deficits and increase her safety with mobility. Skilled PT services are indicated to address the above deficits.     Follow Up Recommendations Home health PT    Equipment Recommendations   (Patient states she has a RW at home. )    Recommendations for Other Services       Precautions / Restrictions Precautions Precautions: Fall Restrictions Weight Bearing Restrictions: No      Mobility  Bed Mobility Overal bed mobility: Modified Independent             General bed mobility comments: Patient uses handrails to complete supine to sit transfer with no physical assistance or cuing.   Transfers Overall transfer level: Needs assistance   Transfers: Sit to/from Stand Sit to Stand: Supervision         General transfer comment: Patient transferred sit to stand without physical assistance, but felt off balance after  standing.   Ambulation/Gait Ambulation/Gait assistance: Min guard Ambulation Distance (Feet): 200 Feet Assistive device: Rolling walker (2 wheeled) Gait Pattern/deviations: WFL(Within Functional Limits) Gait velocity: Decreased.    General Gait Details: Patient initially took several steps without an AD, and felt off balance so she opted to use a RW. She takes very small slow steps, but did not experience any episodes of losing her balance.   Stairs            Wheelchair Mobility    Modified Rankin (Stroke Patients Only)       Balance Overall balance assessment: Needs assistance Sitting-balance support: Feet unsupported Sitting balance-Leahy Scale: Good       Standing balance-Leahy Scale: Fair Standing balance comment: Patient is able to take several steps without an AD, however she begins reaching for something to hold on to, and opts to use a RW for additional support.                  Standardized Balance Assessment Standardized Balance Assessment :  (Modified DGI - 10/12)           Pertinent Vitals/Pain Pain Assessment:  (Patient does not mention any pain during this session.)    Home Living Family/patient expects to be discharged to:: Private residence Living Arrangements: Children (Son that is home 24/7 aside from going to get groceries. ) Available Help at Discharge: Family Type of Home: House Home Access: Stairs to enter Entrance Stairs-Rails: Can reach both Entrance Stairs-Number of Steps: 3 Home Layout: One level Home Equipment: Environmental consultant -  2 wheels;Cane - single point Additional Comments: Patient does not use an AD at baseline, but has them at the house.     Prior Function Level of Independence: Independent         Comments: Patient had been independent for short community distances, however she has had several falls this year.      Hand Dominance        Extremity/Trunk Assessment   Upper Extremity Assessment: Overall WFL for  tasks assessed           Lower Extremity Assessment: Overall WFL for tasks assessed         Communication   Communication: No difficulties  Cognition Arousal/Alertness: Awake/alert (Yawned several times, but fully participated. ) Behavior During Therapy: WFL for tasks assessed/performed Overall Cognitive Status: Within Functional Limits for tasks assessed                      General Comments      Exercises        Assessment/Plan    PT Assessment Patient needs continued PT services  PT Diagnosis Difficulty walking;Generalized weakness   PT Problem List Decreased strength;Decreased knowledge of use of DME;Decreased balance  PT Treatment Interventions Gait training;Balance training;Therapeutic activities;Therapeutic exercise;Stair training   PT Goals (Current goals can be found in the Care Plan section) Acute Rehab PT Goals Patient Stated Goal: To return home safely (and soon).  PT Goal Formulation: With patient Time For Goal Achievement: 11/18/14 Potential to Achieve Goals: Good    Frequency Min 2X/week   Barriers to discharge        Co-evaluation               End of Session Equipment Utilized During Treatment: Gait belt Activity Tolerance: Patient tolerated treatment well;Patient limited by lethargy Patient left: in chair;with call bell/phone within reach;with chair alarm set Nurse Communication: Mobility status    Functional Assessment Tool Used: Modified DGI  Functional Limitation: Mobility: Walking and moving around Mobility: Walking and Moving Around Current Status ([id number]([id number]): At least 1 percent but less than 20 percent impaired, limited or restricted Mobility: Walking and Moving Around Goal Status [phone]([id number]): At least 1 percent but less than 20 percent impaired, limited or restricted    Time: 1445-1505 PT Time Calculation (min) (ACUTE ONLY): 20 min   Charges:   PT Evaluation $Initial PT Evaluation Tier I: 1 Procedure     PT G Codes:    PT G-Codes **NOT FOR INPATIENT CLASS** Functional Assessment Tool Used: Modified DGI  Functional Limitation: Mobility: Walking and moving around Mobility: Walking and Moving Around Current Status ([id number](G8978): At least 1 percent but less than 20 percent impaired, limited or restricted Mobility: Walking and Moving Around Goal Status [phone]([id number]): At least 1 percent but less than 20 percent impaired, limited or restricted   Kerin RansomPatrick A McNamara, PT, DPT   11/04/2014, 3:29 PM

## 2014-11-04 NOTE — Care Management (Signed)
Patient presents from home and is independent in her alds. Admitted under observation for near syncope.  Physical therapy evaluated and recommended patient may benefit from home health physical therapy.  Later found that patient has medicaid as a payor and home health physical therapy would not be a covered service with current diagnosis.  Her blood pressure meds have been placed on hold.  No problems accessing medical care, meds or tranpsortation

## 2014-11-05 DIAGNOSIS — Y92009 Unspecified place in unspecified non-institutional (private) residence as the place of occurrence of the external cause: Secondary | ICD-10-CM | POA: Diagnosis not present

## 2014-11-05 DIAGNOSIS — I10 Essential (primary) hypertension: Secondary | ICD-10-CM | POA: Diagnosis present

## 2014-11-05 DIAGNOSIS — K219 Gastro-esophageal reflux disease without esophagitis: Secondary | ICD-10-CM | POA: Diagnosis present

## 2014-11-05 DIAGNOSIS — I611 Nontraumatic intracerebral hemorrhage in hemisphere, cortical: Secondary | ICD-10-CM | POA: Diagnosis present

## 2014-11-05 DIAGNOSIS — Z87891 Personal history of nicotine dependence: Secondary | ICD-10-CM | POA: Diagnosis not present

## 2014-11-05 DIAGNOSIS — H6690 Otitis media, unspecified, unspecified ear: Secondary | ICD-10-CM | POA: Diagnosis present

## 2014-11-05 DIAGNOSIS — D649 Anemia, unspecified: Secondary | ICD-10-CM | POA: Diagnosis present

## 2014-11-05 DIAGNOSIS — I509 Heart failure, unspecified: Secondary | ICD-10-CM | POA: Diagnosis present

## 2014-11-05 DIAGNOSIS — E11649 Type 2 diabetes mellitus with hypoglycemia without coma: Secondary | ICD-10-CM | POA: Diagnosis present

## 2014-11-05 DIAGNOSIS — I618 Other nontraumatic intracerebral hemorrhage: Secondary | ICD-10-CM | POA: Diagnosis present

## 2014-11-05 DIAGNOSIS — I248 Other forms of acute ischemic heart disease: Secondary | ICD-10-CM | POA: Diagnosis present

## 2014-11-05 DIAGNOSIS — M1990 Unspecified osteoarthritis, unspecified site: Secondary | ICD-10-CM | POA: Diagnosis present

## 2014-11-05 DIAGNOSIS — E1140 Type 2 diabetes mellitus with diabetic neuropathy, unspecified: Secondary | ICD-10-CM | POA: Diagnosis present

## 2014-11-05 DIAGNOSIS — R55 Syncope and collapse: Secondary | ICD-10-CM

## 2014-11-05 DIAGNOSIS — E860 Dehydration: Secondary | ICD-10-CM | POA: Diagnosis present

## 2014-11-05 DIAGNOSIS — I959 Hypotension, unspecified: Secondary | ICD-10-CM | POA: Diagnosis present

## 2014-11-05 DIAGNOSIS — E785 Hyperlipidemia, unspecified: Secondary | ICD-10-CM | POA: Diagnosis present

## 2014-11-05 DIAGNOSIS — F329 Major depressive disorder, single episode, unspecified: Secondary | ICD-10-CM | POA: Diagnosis present

## 2014-11-05 DIAGNOSIS — E876 Hypokalemia: Secondary | ICD-10-CM | POA: Diagnosis present

## 2014-11-05 DIAGNOSIS — Z8249 Family history of ischemic heart disease and other diseases of the circulatory system: Secondary | ICD-10-CM | POA: Diagnosis not present

## 2014-11-05 DIAGNOSIS — T447X5A Adverse effect of beta-adrenoreceptor antagonists, initial encounter: Secondary | ICD-10-CM | POA: Diagnosis present

## 2014-11-05 DIAGNOSIS — M549 Dorsalgia, unspecified: Secondary | ICD-10-CM | POA: Diagnosis present

## 2014-11-05 LAB — GLUCOSE, CAPILLARY
GLUCOSE-CAPILLARY: 163 mg/dL — AB (ref 65–99)
GLUCOSE-CAPILLARY: 168 mg/dL — AB (ref 65–99)
Glucose-Capillary: 160 mg/dL — ABNORMAL HIGH (ref 65–99)
Glucose-Capillary: 141 mg/dL — ABNORMAL HIGH (ref 65–99)

## 2014-11-05 MED ORDER — HYDRALAZINE HCL 50 MG PO TABS
100.0000 mg | ORAL_TABLET | Freq: Three times a day (TID) | ORAL | Status: DC
  Administered 2014-11-05 – 2014-11-06 (×4): 100 mg via ORAL
  Filled 2014-11-05 (×4): qty 2

## 2014-11-05 MED ORDER — ONDANSETRON HCL 4 MG/2ML IJ SOLN
4.0000 mg | Freq: Four times a day (QID) | INTRAMUSCULAR | Status: DC | PRN
  Administered 2014-11-05: 4 mg via INTRAVENOUS
  Filled 2014-11-05: qty 2

## 2014-11-05 MED ORDER — CLONIDINE HCL 0.1 MG PO TABS
0.1000 mg | ORAL_TABLET | Freq: Every day | ORAL | Status: DC
  Administered 2014-11-05 – 2014-11-06 (×2): 0.1 mg via ORAL
  Filled 2014-11-05 (×2): qty 1

## 2014-11-05 NOTE — Progress Notes (Signed)
[facility] [place] at [facility]                                                                                                                                                                                            Patient Demographics   Jamie Austin, is a 59 y.o. female, DOB - [birth year], EAV:[id number]  Admit date - 11/03/2014   Admitting Physician Alford Highland, MD  Outpatient Primary MD for the patient is WHITE, Arlyss Repress, NP   LOS - 0  Subjective: He continues to complaint of a pressure in her head, also some dizziness     Review of Systems:   CONSTITUTIONAL: No documented fever. No fatigue, weakness. No weight gain, no weight loss.  EYES: No blurry or double vision.  ENT: No tinnitus. No postnasal drip. No redness of the oropharynx.  RESPIRATORY: No cough, no wheeze, no hemoptysis. No dyspnea.  CARDIOVASCULAR: No chest pain. No orthopnea. No palpitations. No syncope.  GASTROINTESTINAL: No nausea, no vomiting or diarrhea. No abdominal pain. No melena or hematochezia.  GENITOURINARY: No dysuria or hematuria.  ENDOCRINE: No polyuria or nocturia. No heat or cold intolerance.  HEMATOLOGY: No anemia. No bruising. No bleeding.  INTEGUMENTARY: No rashes. No lesions.  MUSCULOSKELETAL: No arthritis. No swelling. No gout.  NEUROLOGIC: No numbness, tingling, or ataxia. No seizure-type activity.  PSYCHIATRIC: No anxiety. No insomnia. No ADD.    Vitals:   Filed Vitals:   11/05/14 1019 11/05/14 1048 11/05/14 1147 11/05/14 1153  BP:  192/102 164/90 155/79  Pulse:    81  Temp: 99.4 F (37.4 C)   98.4 F (36.9 C)  TempSrc: Oral   Oral  Resp: 18   18  Height:      Weight:      SpO2: 98%   97%    Wt Readings from Last 3 Encounters:  11/03/14 92.08 kg (203 lb)  10/07/14 104 kg (229 lb 4.5 oz)  04/26/13 97.325 kg (214 lb 9 oz)     Intake/Output Summary (Last 24 hours) at 11/05/14 1159 Last data filed at 11/05/14 1050  Gross per 24 hour   Intake    603 ml  Output    800 ml  Net   -197 ml    Physical Exam:   GENERAL: Pleasant-appearing in no apparent distress.  HEAD, EYES, EARS, NOSE AND THROAT: Atraumatic, normocephalic. Extraocular muscles are intact. Pupils equal and reactive to light. Sclerae anicteric. No conjunctival injection. No oro-pharyngeal erythema.  NECK: Supple. There is no jugular venous distention. No bruits, no lymphadenopathy, no thyromegaly.  HEART: Regular rate and rhythm, tachycardic. No murmurs,  no rubs, no clicks.  LUNGS: Clear to auscultation bilaterally. No rales or rhonchi. No wheezes.  ABDOMEN: Soft, flat, nontender, nondistended. Has good bowel sounds. No hepatosplenomegaly appreciated.  EXTREMITIES: No evidence of any cyanosis, clubbing, or peripheral edema.  +2 pedal and radial pulses bilaterally.  NEUROLOGIC: The patient is alert, awake, and oriented x3 with no focal motor or sensory deficits appreciated bilaterally.  SKIN: Moist and warm with no rashes appreciated.  Psych: Not anxious, depressed LN: No inguinal LN enlargement    Antibiotics   Anti-infectives    Start     Dose/Rate Route Frequency Ordered Stop   11/03/14 1200  amoxicillin-clavulanate (AUGMENTIN) 875-125 MG per tablet 1 tablet     1 tablet Oral Every 12 hours 11/03/14 1154        Medications   Scheduled Meds: . amLODipine  10 mg Oral Daily  . amoxicillin-clavulanate  1 tablet Oral Q12H  . aspirin EC  81 mg Oral Daily  . atorvastatin  40 mg Oral Daily  . carvedilol  25 mg Oral BID WC  . chlorhexidine  15 mL Mouth/Throat BID  . cloNIDine  0.1 mg Oral Daily  . ferrous sulfate  325 mg Oral Q breakfast  . gabapentin  800 mg Oral TID  . hydrALAZINE  100 mg Oral TID  . insulin aspart  0-5 Units Subcutaneous QHS  . insulin aspart  0-9 Units Subcutaneous TID WC  . metFORMIN  1,000 mg Oral BID WC  . nystatin cream  1 application Topical BID  . pantoprazole  40 mg Oral Daily  . sertraline  150 mg Oral Daily  . sodium  chloride  3 mL Intravenous Q12H   Continuous Infusions:  PRN Meds:.acetaminophen **OR** acetaminophen, ondansetron (ZOFRAN) IV   Data Review:   Micro Results Recent Results (from the past 240 hour(s))  Culture, blood (routine x 2)     Status: None (Preliminary result)   Collection Time: 11/03/14  1:11 PM  Result Value Ref Range Status   Specimen Description BLOOD LEFT ASSIST CONTROL  Final   Special Requests   Final    BOTTLES DRAWN AEROBIC AND ANAEROBIC  3 CC AEROBIC, 10 CC ANAEROBIC   Culture NO GROWTH 2 DAYS  Final   Report Status PENDING  Incomplete  Culture, blood (routine x 2)     Status: None (Preliminary result)   Collection Time: 11/03/14  1:14 PM  Result Value Ref Range Status   Specimen Description BLOOD LEFT THUMB  Final   Special Requests   Final    BOTTLES DRAWN AEROBIC AND ANAEROBIC  5 CC AEOROBIC, 12 CC ANAEROBIC   Culture NO GROWTH 2 DAYS  Final   Report Status PENDING  Incomplete    Radiology Reports Ct Head Wo Contrast  11/03/2014   CLINICAL DATA:  Decreased level of consciousness, somnolent, history hypertension, diabetes mellitus, hepatitis-C, CHF, GERD, anemia  EXAM: CT HEAD WITHOUT CONTRAST  TECHNIQUE: Contiguous axial images were obtained from the base of the skull through the vertex without intravenous contrast.  COMPARISON:  08/12/2014  FINDINGS: Generalized atrophy.  Prominent cisterna magna, normal variant.  No midline shift or mass effect.  Streak artifacts at skull base.  Otherwise normal appearance of brain parenchyma.  No intracranial hemorrhage, mass lesion or evidence acute infarction.  No extra-axial fluid collections.  Bones and sinuses unremarkable.  Area of questionable low-attenuation lateral to the RIGHT caudate head on the previous exam is much less conspicuous.  IMPRESSION: No acute intracranial abnormalities.  Electronically Signed   By: Ulyses Southward M.D.   On: 11/03/2014 10:05   Mr Brain Wo Contrast  11/04/2014   CLINICAL DATA:  Dizziness.   EXAM: MRI HEAD WITHOUT CONTRAST  TECHNIQUE: Multiplanar, multiecho pulse sequences of the brain and surrounding structures were obtained without intravenous contrast.  COMPARISON:  CT head 11/03/2014  FINDINGS: Cerebral volume normal for age. Negative for hydrocephalus. 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  Negative for acute infarct. Chronic microvascular ischemic change in the white matter is mild. Moderate chronic microvascular ischemic change in the pons. 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 Mild chronic ischemic change in the basal ganglia bilaterally.  8 x 10 mm chronic hemorrhage left body of caudate. This could represent hypertensive hemorrhage or cavernoma. Additional micro hemorrhage in the left parietal lobe.  Negative for mass lesion.  No shift of the midline structures  Mild mucosal edema paranasal sinuses. Pituitary not enlarged. No orbital lesion.  IMPRESSION: Negative for acute infarct. Chronic microvascular ischemic changes, most prominent in the brainstem  8 x 10 mm chronic hemorrhage left caudate possible cavernoma or hypertensive hemorrhage. Additional small micro hemorrhage left parietal lobe.  Retro cerebellar cyst appears benign.   Electronically Signed   By: Marlan Palau M.D.   On: 11/04/2014 12:08   Dg Chest Portable 1 View  11/03/2014   CLINICAL DATA:  Decreased level of consciousness today.  Weakness.  EXAM: PORTABLE CHEST - 1 VIEW  COMPARISON:  Single view of the chest 08/12/2014.  FINDINGS: Heart size is enlarged but there is no pulmonary edema. The lungs are clear. No pneumothorax or pleural fluid.  IMPRESSION: Cardiomegaly without acute disease.   Electronically Signed   By: Drusilla Kanner M.D.   On: 11/03/2014 09:33     CBC  Recent Labs Lab 11/03/14 0913 11/03/14 1311 11/04/14 0501  WBC 7.2 8.1 7.0  HGB 10.6* 11.1* 9.2*  HCT 32.4* 33.3* 28.4*  PLT 134* 142* 120*  MCV  88.4 88.5 89.2  MCH 29.0 29.4 28.8  MCHC 32.8 33.2 32.3  RDW 14.7* 14.8* 14.7*  LYMPHSABS 1.7  --   --   MONOABS 0.5  --   --   EOSABS 0.0  --   --   BASOSABS 0.0  --   --     Chemistries   Recent Labs Lab 11/03/14 0913 11/03/14 1311 11/04/14 0501  NA 139  --  142  K 3.0*  --  3.6  CL 104  --  111  CO2 24  --  24  GLUCOSE 287*  --  178*  BUN 21*  --  27*  CREATININE 1.40* 1.41* 1.25*  CALCIUM 8.6*  --  8.3*  MG  --  2.0  --   AST 57*  --   --   ALT 40  --   --   ALKPHOS 82  --   --   BILITOT 0.3  --   --    ------------------------------------------------------------------------------------------------------------------ estimated creatinine clearance is 56.3 mL/min (by C-G formula based on Cr of 1.25). ------------------------------------------------------------------------------------------------------------------  Recent Labs  11/03/14 1311  HGBA1C 6.4*   ------------------------------------------------------------------------------------------------------------------  Recent Labs  11/04/14 0501  CHOL 115  HDL 36*  LDLCALC 58  TRIG 409  CHOLHDL 3.2   ------------------------------------------------------------------------------------------------------------------ No results for input(s): TSH, T4TOTAL, T3FREE, THYROIDAB in the last 72 hours.  Invalid input(s): FREET3 ------------------------------------------------------------------------------------------------------------------ No results for input(s): VITAMINB12, FOLATE, FERRITIN,  TIBC, IRON, RETICCTPCT in the last 72 hours.  Coagulation profile No results for input(s): INR, PROTIME in the last 168 hours.  No results for input(s): DDIMER in the last 72 hours.  Cardiac Enzymes  Recent Labs Lab 11/03/14 1311 11/03/14 1849 11/04/14 0028  TROPONINI 0.13* 0.11* 0.10*   ------------------------------------------------------------------------------------------------------------------ Invalid  input(s): POCBNP    Assessment & Plan   1. Near syncope, relative hypotension and dehydration. Now blood pressure elevated patient continues to complaint of feeling dizzy I will get a MRI of the brain to make sure that there is no abnormality even though CT scan of the head is negative 2. Elevated troponin- likely secondary to demand ischemia with no chest pain or shortness of breath will monitor on telemetry and get serial cardiac enzymes. Continue aspirin.. 3. Hypokalemia- likely secondary blood pressure  pills. Status post replacement 4. Otitis media-continue Augmentin. 5. Hyperlipidemia unspecified continue Lipitor. 6. Chronic anemia continue ferrous sulfate. 7. Diabetes with neuropathy. sliding scale for right now hold Glucophage and continue the patient's gabapentin. 8. Gastroesophageal reflux disease without esophagitis continue PPI 9. Depression- continue Zoloft.     Code Status Orders        Start     Ordered   11/03/14 1150  Full code   Continuous     11/03/14 1150           Consults  none  DVT Prophylaxis aspirin  Lab Results  Component Value Date   PLT 120* 11/04/2014     Time Spent in minutes   35min  Greater than 50% of time spent in care coordination and counseling.   Auburn BilberryPATEL, Joah Patlan M.D on 11/05/2014 at 11:59 AM  Between 7am to 6pm - Pager - [phone]  After 6pm go to [email or web address] - password [email or web address] [facility]  [facility]   Office  [phone]

## 2014-11-05 NOTE — Progress Notes (Signed)
[facility] [place] at [facility]                                                                                                                                                                                            Patient Demographics   Jamie Austin, is a 59 y.o. female, DOB - Nov 23, 1955, ZOX:[id number]  Admit date - 11/03/2014   Admitting Physician Alford Highland, MD  Outpatient Primary MD for the patient is WHITE, Arlyss Repress, NP   LOS - 0  Subjective: Patient had a MRI of the brain yesterday which is abnormal blood pressure continues to be elevated dizziness improved     Review of Systems:   CONSTITUTIONAL: No documented fever. No fatigue, weakness. No weight gain, no weight loss.  EYES: No blurry or double vision.  ENT: No tinnitus. No postnasal drip. No redness of the oropharynx.  RESPIRATORY: No cough, no wheeze, no hemoptysis. No dyspnea.  CARDIOVASCULAR: No chest pain. No orthopnea. No palpitations. No syncope.  GASTROINTESTINAL: No nausea, no vomiting or diarrhea. No abdominal pain. No melena or hematochezia.  GENITOURINARY: No dysuria or hematuria.  ENDOCRINE: No polyuria or nocturia. No heat or cold intolerance.  HEMATOLOGY: No anemia. No bruising. No bleeding.  INTEGUMENTARY: No rashes. No lesions.  MUSCULOSKELETAL: No arthritis. No swelling. No gout.  NEUROLOGIC: No numbness, tingling, or ataxia. No seizure-type activity.  PSYCHIATRIC: No anxiety. No insomnia. No ADD.    Vitals:   Filed Vitals:   11/05/14 1019 11/05/14 1048 11/05/14 1147 11/05/14 1153  BP:  192/102 164/90 155/79  Pulse:    81  Temp: 99.4 F (37.4 C)   98.4 F (36.9 C)  TempSrc: Oral   Oral  Resp: 18   18  Height:      Weight:      SpO2: 98%   97%    Wt Readings from Last 3 Encounters:  11/03/14 92.08 kg (203 lb)  10/07/14 104 kg (229 lb 4.5 oz)  04/26/13 97.325 kg (214 lb 9 oz)     Intake/Output Summary (Last 24 hours) at 11/05/14 1203 Last  data filed at 11/05/14 1050  Gross per 24 hour  Intake    603 ml  Output    800 ml  Net   -197 ml    Physical Exam:   GENERAL: Pleasant-appearing in no apparent distress.  HEAD, EYES, EARS, NOSE AND THROAT: Atraumatic, normocephalic. Extraocular muscles are intact. Pupils equal and reactive to light. Sclerae anicteric. No conjunctival injection. No oro-pharyngeal erythema.  NECK: Supple. There is no jugular venous distention. No bruits, no lymphadenopathy, no thyromegaly.  HEART: Regular  rate and rhythm, tachycardic. No murmurs, no rubs, no clicks.  LUNGS: Clear to auscultation bilaterally. No rales or rhonchi. No wheezes.  ABDOMEN: Soft, flat, nontender, nondistended. Has good bowel sounds. No hepatosplenomegaly appreciated.  EXTREMITIES: No evidence of any cyanosis, clubbing, or peripheral edema.  +2 pedal and radial pulses bilaterally.  NEUROLOGIC: The patient is alert, awake, and oriented x3 with no focal motor or sensory deficits appreciated bilaterally.  SKIN: Moist and warm with no rashes appreciated.  Psych: Not anxious, depressed LN: No inguinal LN enlargement    Antibiotics   Anti-infectives    Start     Dose/Rate Route Frequency Ordered Stop   11/03/14 1200  amoxicillin-clavulanate (AUGMENTIN) 875-125 MG per tablet 1 tablet     1 tablet Oral Every 12 hours 11/03/14 1154        Medications   Scheduled Meds: . amLODipine  10 mg Oral Daily  . amoxicillin-clavulanate  1 tablet Oral Q12H  . aspirin EC  81 mg Oral Daily  . atorvastatin  40 mg Oral Daily  . carvedilol  25 mg Oral BID WC  . chlorhexidine  15 mL Mouth/Throat BID  . cloNIDine  0.1 mg Oral Daily  . ferrous sulfate  325 mg Oral Q breakfast  . gabapentin  800 mg Oral TID  . hydrALAZINE  100 mg Oral TID  . insulin aspart  0-5 Units Subcutaneous QHS  . insulin aspart  0-9 Units Subcutaneous TID WC  . metFORMIN  1,000 mg Oral BID WC  . nystatin cream  1 application Topical BID  . pantoprazole  40 mg Oral  Daily  . sertraline  150 mg Oral Daily  . sodium chloride  3 mL Intravenous Q12H   Continuous Infusions:  PRN Meds:.acetaminophen **OR** acetaminophen, ondansetron (ZOFRAN) IV   Data Review:   Micro Results Recent Results (from the past 240 hour(s))  Culture, blood (routine x 2)     Status: None (Preliminary result)   Collection Time: 11/03/14  1:11 PM  Result Value Ref Range Status   Specimen Description BLOOD LEFT ASSIST CONTROL  Final   Special Requests   Final    BOTTLES DRAWN AEROBIC AND ANAEROBIC  3 CC AEROBIC, 10 CC ANAEROBIC   Culture NO GROWTH 2 DAYS  Final   Report Status PENDING  Incomplete  Culture, blood (routine x 2)     Status: None (Preliminary result)   Collection Time: 11/03/14  1:14 PM  Result Value Ref Range Status   Specimen Description BLOOD LEFT THUMB  Final   Special Requests   Final    BOTTLES DRAWN AEROBIC AND ANAEROBIC  5 CC AEOROBIC, 12 CC ANAEROBIC   Culture NO GROWTH 2 DAYS  Final   Report Status PENDING  Incomplete    Radiology Reports Ct Head Wo Contrast  11/03/2014   CLINICAL DATA:  Decreased level of consciousness, somnolent, history hypertension, diabetes mellitus, hepatitis-C, CHF, GERD, anemia  EXAM: CT HEAD WITHOUT CONTRAST  TECHNIQUE: Contiguous axial images were obtained from the base of the skull through the vertex without intravenous contrast.  COMPARISON:  08/12/2014  FINDINGS: Generalized atrophy.  Prominent cisterna magna, normal variant.  No midline shift or mass effect.  Streak artifacts at skull base.  Otherwise normal appearance of brain parenchyma.  No intracranial hemorrhage, mass lesion or evidence acute infarction.  No extra-axial fluid collections.  Bones and sinuses unremarkable.  Area of questionable low-attenuation lateral to the RIGHT caudate head on the previous exam is much less conspicuous.  IMPRESSION: No acute intracranial abnormalities.   Electronically Signed   By: Ulyses SouthwardMark  Boles M.D.   On: 11/03/2014 10:05   Mr Brain Wo  Contrast  11/04/2014   CLINICAL DATA:  Dizziness.  EXAM: MRI HEAD WITHOUT CONTRAST  TECHNIQUE: Multiplanar, multiecho pulse sequences of the brain and surrounding structures were obtained without intravenous contrast.  COMPARISON:  CT head 11/03/2014  FINDINGS: Cerebral volume normal for age. Negative for hydrocephalus. 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  Negative for acute infarct. Chronic microvascular ischemic change in the white matter is mild. Moderate chronic microvascular ischemic change in the pons. 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 Mild chronic ischemic change in the basal ganglia bilaterally.  8 x 10 mm chronic hemorrhage left body of caudate. This could represent hypertensive hemorrhage or cavernoma. Additional micro hemorrhage in the left parietal lobe.  Negative for mass lesion.  No shift of the midline structures  Mild mucosal edema paranasal sinuses. Pituitary not enlarged. No orbital lesion.  IMPRESSION: Negative for acute infarct. Chronic microvascular ischemic changes, most prominent in the brainstem  8 x 10 mm chronic hemorrhage left caudate possible cavernoma or hypertensive hemorrhage. Additional small micro hemorrhage left parietal lobe.  Retro cerebellar cyst appears benign.   Electronically Signed   By: Marlan Palauharles  Clark M.D.   On: 11/04/2014 12:08   Dg Chest Portable 1 View  11/03/2014   CLINICAL DATA:  Decreased level of consciousness today.  Weakness.  EXAM: PORTABLE CHEST - 1 VIEW  COMPARISON:  Single view of the chest 08/12/2014.  FINDINGS: Heart size is enlarged but there is no pulmonary edema. The lungs are clear. No pneumothorax or pleural fluid.  IMPRESSION: Cardiomegaly without acute disease.   Electronically Signed   By: Drusilla Kannerhomas  Dalessio M.D.   On: 11/03/2014 09:33     CBC  Recent Labs Lab 11/03/14 0913 11/03/14 1311 11/04/14 0501  WBC 7.2 8.1 7.0  HGB 10.6* 11.1* 9.2*   HCT 32.4* 33.3* 28.4*  PLT 134* 142* 120*  MCV 88.4 88.5 89.2  MCH 29.0 29.4 28.8  MCHC 32.8 33.2 32.3  RDW 14.7* 14.8* 14.7*  LYMPHSABS 1.7  --   --   MONOABS 0.5  --   --   EOSABS 0.0  --   --   BASOSABS 0.0  --   --     Chemistries   Recent Labs Lab 11/03/14 0913 11/03/14 1311 11/04/14 0501  NA 139  --  142  K 3.0*  --  3.6  CL 104  --  111  CO2 24  --  24  GLUCOSE 287*  --  178*  BUN 21*  --  27*  CREATININE 1.40* 1.41* 1.25*  CALCIUM 8.6*  --  8.3*  MG  --  2.0  --   AST 57*  --   --   ALT 40  --   --   ALKPHOS 82  --   --   BILITOT 0.3  --   --    ------------------------------------------------------------------------------------------------------------------ estimated creatinine clearance is 56.3 mL/min (by C-G formula based on Cr of 1.25). ------------------------------------------------------------------------------------------------------------------  Recent Labs  11/03/14 1311  HGBA1C 6.4*   ------------------------------------------------------------------------------------------------------------------  Recent Labs  11/04/14 0501  CHOL 115  HDL 36*  LDLCALC 58  TRIG 161107  CHOLHDL 3.2   ------------------------------------------------------------------------------------------------------------------ No results for input(s): TSH, T4TOTAL, T3FREE, THYROIDAB in the last 72 hours.  Invalid input(s): FREET3 ------------------------------------------------------------------------------------------------------------------  No results for input(s): VITAMINB12, FOLATE, FERRITIN, TIBC, IRON, RETICCTPCT in the last 72 hours.  Coagulation profile No results for input(s): INR, PROTIME in the last 168 hours.  No results for input(s): DDIMER in the last 72 hours.  Cardiac Enzymes  Recent Labs Lab 11/03/14 1311 11/03/14 1849 11/04/14 0028  TROPONINI 0.13* 0.11* 0.10*    ------------------------------------------------------------------------------------------------------------------ Invalid input(s): POCBNP    Assessment & Plan   1. Near syncope, relative hypotension and dehydration. Now blood pressure elevated patient patient's blood pressure medications have been resumed 2. 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 Hypokalemia- likely secondary blood pressure  pills. Status post replacement 4. Otitis media-continue Augmentin. 5. Hyperlipidemia unspecified continue Lipitor. 6. Chronic anemia continue ferrous sulfate. 7. Diabetes with neuropathy. sliding scale for right now hold Glucophage and continue the patient's gabapentin. 8. Gastroesophageal reflux disease without esophagitis continue PPI 9. Depression- continue Zoloft. 10. Limited troponin likely due to demand ischemia monitor blood pressure     Code Status Orders        Start     Ordered   11/03/14 1150  Full code   Continuous     11/03/14 1150           Consults  none  DVT Prophylaxis aspirin  Lab Results  Component Value Date   PLT 120* 11/04/2014     Time Spent in minutes    Greater than 50% of time spent in care coordination and counseling.   Auburn Bilberry M.D on 11/05/2014 at 12:03 PM  Between 7am to 6pm - Pager - [phone]  After 6pm go to [email or web address] - password [email or web address] [facility]  [facility]   Office  [phone]

## 2014-11-05 NOTE — Progress Notes (Addendum)
Pt stable, oob to chair today, no c/o pain, NSR. Neuro checks are intact. Will continue to assess for syncope.

## 2014-11-05 NOTE — Consult Note (Signed)
Neurology:  59 y.o. female with a known history of hypertension, diabetes, gastroesophageal reflux disease, hyperlipidemia, anemia. Admitted with ataxia/gait instability, ? Presyncopal event.   Pt has been on numerous anti hypertensive medications and admitted with brief hypotension.    CTH no acute intracranial abnormality.   MRI reviewed: On GRE/SWI sequence pt does have L caudate chronic hemorrhage which could be a cavernoma as well as small L parietal chronic hemorrhage.   Both of these findings are incidental and not contributing to pt's symptoms.    Would not perform any further imaging from neuro perspective.  Anti hypertensive management.  Likely d/c planning.

## 2014-11-05 NOTE — Care Management (Signed)
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  Patient should qualify for home health PT with this diagnosis

## 2014-11-05 NOTE — Progress Notes (Signed)
Physical Therapy Treatment Patient Details Name: Jamie ReichmannJoy Austin MRN: [id number] DOB: [birth year] Today's Date: 11/05/2014    History of Present Illness Patient is a 59 y/o female that presents after experiencing severe dizziness while going for a routine blood pressure check. Patient had 6 teeth pulled last week and has not been eating well since. Upon admission she was found to be hypotensive. No acute cranial abnormalities noted, though a chronic 8x3210mm hemorrhage was seen in the left caudate.     PT Comments    Spoke with nursing prior to treatment; nursing notes pt okay to participate with PT, watching for symptoms of blood pressure issues. Pt reports general malaise today and fatigue. Pt denies pain. Pt requires encouragement to participate in PT, but does so post encouragement. Pt notes mild lightheadedness with stand; therefore, ambulation kept to short distance in the room. Pt comfortably up in chair and tolerated exercises well. Pt encouraged to continue with LE exercises several times today.    Follow Up Recommendations  Home health PT     Equipment Recommendations       Recommendations for Other Services       Precautions / Restrictions Restrictions Weight Bearing Restrictions: No    Mobility  Bed Mobility Overal bed mobility: Modified Independent             General bed mobility comments: Use of handrails; no dizziness sitting edge of bed  Transfers Overall transfer level: Needs assistance Equipment used: Rolling walker (2 wheeled) Transfers: Sit to/from Stand Sit to Stand: Supervision         General transfer comment: Mild complaints of lightheadedness with stand  Ambulation/Gait Ambulation/Gait assistance: Min guard Ambulation Distance (Feet): 20 Feet Assistive device: Rolling walker (2 wheeled) Gait Pattern/deviations: Step-through pattern;Decreased step length - right;Decreased step length - left Gait velocity: Reduced Gait velocity interpretation:  Below normal speed for age/gender General Gait Details: Ambulates slowly without LOB, but feels slightly unsteady. Ambulates stiffly/guarded   Stairs            Wheelchair Mobility    Modified Rankin (Stroke Patients Only)       Balance             Standing balance-Leahy Scale: Fair                      Cognition Arousal/Alertness: Lethargic Behavior During Therapy: WFL for tasks assessed/performed Overall Cognitive Status: Within Functional Limits for tasks assessed                      Exercises General Exercises - Lower Extremity Ankle Circles/Pumps: AROM;20 reps;Seated;Both Quad Sets: Strengthening;Both;20 reps;Seated Gluteal Sets: Strengthening;Both;20 reps;Seated Long Arc Quad: AROM;Both;20 reps;Seated Heel Slides: AROM;Both;20 reps;Seated Hip ABduction/ADduction: AROM;Both;20 reps;Seated Hip Flexion/Marching: AROM;Both;20 reps;Seated Toe Raises: AROM;Both;15 reps;Seated Heel Raises: AROM;Both;20 reps;Seated    General Comments        Pertinent Vitals/Pain Pain Assessment: No/denies pain    Home Living                      Prior Function            PT Goals (current goals can now be found in the care plan section) Progress towards PT goals: Progressing toward goals    Frequency  Min 2X/week    PT Plan Current plan remains appropriate    Co-evaluation             End of Session Equipment Utilized  During Treatment: Gait belt Activity Tolerance: Patient tolerated treatment well;Patient limited by fatigue (limited by lightheadedness) Patient left: in chair;with call bell/phone within reach;with chair alarm set     Time: 7846-9629 PT Time Calculation (min) (ACUTE ONLY): 26 min  Charges:  $Gait Training: 8-22 mins $Therapeutic Exercise: 8-22 mins                    G Codes:      Kristeen Miss 11/05/2014, 3:04 PM

## 2014-11-06 LAB — GLUCOSE, CAPILLARY
Glucose-Capillary: 152 mg/dL — ABNORMAL HIGH (ref 65–99)
Glucose-Capillary: 160 mg/dL — ABNORMAL HIGH (ref 65–99)

## 2014-11-06 MED ORDER — AMOXICILLIN-POT CLAVULANATE 875-125 MG PO TABS: 1.0000 | ORAL_TABLET | Freq: Two times a day (BID) | ORAL | Status: AC

## 2014-11-06 MED ORDER — ACETAMINOPHEN 325 MG PO TABS: 650.0000 mg | ORAL_TABLET | Freq: Four times a day (QID) | ORAL | Status: AC | PRN

## 2014-11-06 MED ORDER — CLONIDINE HCL 0.1 MG PO TABS: 0.1000 mg | ORAL_TABLET | Freq: Every day | ORAL | Status: AC

## 2014-11-06 NOTE — Care Management Note (Signed)
Case Management Note  Patient Details  Name: Talise Sligh MRN: [id number] Date of Birth: [birth year]  Subjective/Objective:  Met with patient. She lives at home with her son. She uses a walker and does not drive. PT recommending home PT. Medicaid will not cover home PT. Offered referral to [facility] and patient declined as she is not able to get there as needed. No need identified for other home health needs. Denies difficulty accessing medications or medical care. No need idientified.                    Action/Plan:  Home with self care Expected Discharge Date:                  Expected Discharge Plan:     In-House Referral:     Discharge planning Services     Post Acute Care Choice:    Choice offered to:     DME Arranged:    DME Agency:     HH Arranged:    [place] Agency:     Status of Service:     Medicare Important Message Given:    Date Medicare IM Given:    Medicare IM give by:    Date Additional Medicare IM Given:    Additional Medicare Important Message give by:     If discussed at [place] of Stay Meetings, dates discussed:    Additional Comments:  Jolly Mango, RN 11/06/2014, 9:37 AM

## 2014-11-06 NOTE — Discharge Summary (Signed)
Jamie Austin, 59 y.o., DOB 1955/12/10, MRN [id number]. 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 female with a known history of hypertension, diabetes, gastroesophageal reflux disease, hyperlipidemia, anemia. She was going for a blood pressure check today and felt that she could hardly walk. She felt like she was going to pass out. She felt weak and hot and clammy and the room was spinning to the right. Now her head feels weird and she did also feel like she was spinning when she got up to go the bathroom here in the ER. On Thursday and Friday she had some nausea vomiting. Of note she had 6 teeth pulled last Wednesday on the top. Her medical doctor is been playing around with the dose of her clonidine. In the emergency room she was found to have a borderline troponin and hypokalemia and her blood pressure was on the lower side. Hospitalist services were contacted for further evaluation. Patient was admitted and started on IV fluids. She had a CT scan of the head which was negative she continue to complaint of feeling dizzy and woozy in her head. Therefore had a MRI of the brainGRE/SWI sequence pt does have L caudate chronic hemorrhage which could be a cavernoma as well as small L parietal chronic hemorrhage.  Both of these findings are incidental and and felt not to be contributing to her symptoms per neurology. Patient is doing better she was seen by physical therapy and they recommended home physical  therapy           Consults  neurology  Significant Tests:  See full reports for all details    Ct Head Wo Contrast  11/03/2014   CLINICAL DATA:  Decreased level of consciousness, somnolent, history hypertension, diabetes mellitus, hepatitis-C, CHF, GERD, anemia  EXAM: CT HEAD WITHOUT CONTRAST  TECHNIQUE: Contiguous axial images were obtained from the base of the skull through the vertex without intravenous contrast.  COMPARISON:  08/12/2014  FINDINGS: Generalized atrophy.  Prominent cisterna magna, normal variant.  No midline shift or mass effect.  Streak artifacts at skull base.  Otherwise normal appearance of brain parenchyma.  No intracranial hemorrhage, mass lesion or evidence acute infarction.  No extra-axial fluid collections.  Bones and sinuses unremarkable.  Area of questionable low-attenuation lateral to the RIGHT caudate head on the previous exam is much less conspicuous.  IMPRESSION: No acute intracranial abnormalities.   Electronically Signed   By: Ulyses Southward M.D.   On: 11/03/2014 10:05   Mr Brain Wo Contrast  11/04/2014   CLINICAL DATA:  Dizziness.  EXAM: MRI HEAD WITHOUT CONTRAST  TECHNIQUE: Multiplanar, multiecho pulse sequences of the brain and surrounding structures were obtained without intravenous contrast.  COMPARISON:  CT head 11/03/2014  FINDINGS: Cerebral volume normal for age. Negative for hydrocephalus. 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  Negative for acute infarct. Chronic microvascular ischemic change in the white matter is mild. Moderate chronic microvascular ischemic change in the pons. 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  Mild chronic ischemic change in the basal ganglia bilaterally.  8 x 10 mm chronic hemorrhage left body of caudate. This could represent hypertensive hemorrhage or cavernoma. Additional micro hemorrhage in the left parietal lobe.  Negative for mass  lesion.  No shift of the midline structures  Mild mucosal edema paranasal sinuses. Pituitary not enlarged. No orbital lesion.  IMPRESSION: Negative for acute infarct. Chronic microvascular ischemic changes, most prominent in the brainstem  8 x 10 mm chronic hemorrhage left caudate possible cavernoma or hypertensive hemorrhage. Additional small micro hemorrhage left parietal lobe.  Retro cerebellar cyst appears benign.   Electronically Signed   By: Marlan Palau M.D.   On: 11/04/2014 12:08   Dg Chest Portable 1 View  11/03/2014   CLINICAL DATA:  Decreased level of consciousness today.  Weakness.  EXAM: PORTABLE CHEST - 1 VIEW  COMPARISON:  Single view of the chest 08/12/2014.  FINDINGS: Heart size is enlarged but there is no pulmonary edema. The lungs are clear. No pneumothorax or pleural fluid.  IMPRESSION: Cardiomegaly without acute disease.   Electronically Signed   By: Drusilla Kanner M.D.   On: 11/03/2014 09:33       Today   Subjective:   Jamie Austin  feels better denies any dizziness anxious to go home  Objective:   Blood pressure 135/65, pulse 69, temperature 98.6 F (37 C), temperature source Oral, resp. rate 16, height  (1.651 m), weight 92.08 kg (203 lb), SpO2 97 %.  .  Intake/Output Summary (Last 24 hours) at 11/06/14 1141 Last data filed at 11/06/14 0830  Gross per 24 hour  Intake    600 ml  Output    750 ml  Net   -150 ml    Exam VITAL SIGNS: Blood pressure 135/65, pulse 69, temperature 98.6 F (37 C), temperature source Oral, resp. rate 16, height  (1.651 m), weight 92.08 kg (203 lb), SpO2 97 %.  GENERAL:  59 y.o.-year-old patient lying in the bed with no acute distress.  EYES: Pupils equal, round, reactive to light and accommodation. No scleral icterus. Extraocular muscles intact.  HEENT: Head atraumatic, normocephalic. Oropharynx and nasopharynx clear.  NECK:  Supple, no jugular venous distention. No thyroid enlargement, no tenderness.  LUNGS: Normal breath  sounds bilaterally, no wheezing, rales,rhonchi or crepitation. No use of accessory muscles of respiration.  CARDIOVASCULAR: S1, S2 normal. No murmurs, rubs, or gallops.  ABDOMEN: Soft, nontender, nondistended. Bowel sounds present. No organomegaly or mass.  EXTREMITIES: No pedal edema, cyanosis, or clubbing.  NEUROLOGIC: Cranial nerves II through XII are intact. Muscle strength 5/5 in all extremities. Sensation intact. Gait not checked.  PSYCHIATRIC: The patient is alert and oriented x 3.  SKIN: No obvious rash, lesion, or ulcer.   Data Review     CBC w Diff: Lab Results  Component Value Date   WBC 7.0 11/04/2014   WBC 6.5 08/13/2014   HGB 9.2* 11/04/2014   HGB 8.8* 08/13/2014   HCT 28.4* 11/04/2014   HCT 27.5* 08/13/2014   PLT 120* 11/04/2014   PLT 132* 08/13/2014   LYMPHOPCT 24 11/03/2014   LYMPHOPCT 26.5 08/13/2014   MONOPCT 7 11/03/2014   MONOPCT 6.3 08/13/2014   EOSPCT 0 11/03/2014   EOSPCT 0.8 08/13/2014   BASOPCT 0 11/03/2014   BASOPCT 0.2 08/13/2014   CMP: Lab Results  Component Value Date   NA 142 11/04/2014   NA 141 08/14/2014   K 3.6 11/04/2014   K 4.2 08/14/2014   CL  111 11/04/2014   CL 119* 08/14/2014   CO2 24 11/04/2014   CO2 20* 08/14/2014   BUN 27* 11/04/2014   BUN 40* 08/14/2014   CREATININE 1.25* 11/04/2014   CREATININE 1.41* 08/14/2014   PROT 7.0 11/03/2014   PROT 7.1 08/12/2014   ALBUMIN 3.1* 11/03/2014   ALBUMIN 3.1* 08/12/2014   BILITOT 0.3 11/03/2014   ALKPHOS 82 11/03/2014   ALKPHOS 94 08/12/2014   AST 57* 11/03/2014   AST 28 08/12/2014   ALT 40 11/03/2014   ALT 29 08/12/2014  .  Micro Results Recent Results (from the past 240 hour(s))  Culture, blood (routine x 2)     Status: None (Preliminary result)   Collection Time: 11/03/14  1:11 PM  Result Value Ref Range Status   Specimen Description BLOOD LEFT ASSIST CONTROL  Final   Special Requests   Final    BOTTLES DRAWN AEROBIC AND ANAEROBIC  3 CC AEROBIC, 10 CC ANAEROBIC    Culture NO GROWTH 3 DAYS  Final   Report Status PENDING  Incomplete  Culture, blood (routine x 2)     Status: None (Preliminary result)   Collection Time: 11/03/14  1:14 PM  Result Value Ref Range Status   Specimen Description BLOOD LEFT THUMB  Final   Special Requests   Final    BOTTLES DRAWN AEROBIC AND ANAEROBIC  5 CC AEOROBIC, 12 CC ANAEROBIC   Culture NO GROWTH 3 DAYS  Final   Report Status PENDING  Incomplete        Code Status Orders        Start     Ordered   11/03/14 1150  Full code   Continuous     11/03/14 1150          Follow-up Information    Follow up with WHITE, Arlyss RepressELIZABETH BURNEY, NP. Go on 11/13/2014.   Specialty:  Family Medicine   Why:  Time: 1:20 p.m.   Contact information:   [address] [place] [id number] [phone]       Follow up with Mady HaagensenLATEEF, MUNSOOR, MD. Go on 11/25/2014.   Specialty:  Internal Medicine   Why:  Time: 8:20 a.m.   Contact information:   [address] [place] [address] [phone]       Discharge Medications     Medication List    STOP taking these medications        triamterene-hydrochlorothiazide 37.5-25 MG per capsule  Commonly known as:  DYAZIDE      TAKE these medications        acetaminophen 325 MG tablet  Commonly known as:  TYLENOL  Take 2 tablets (650 mg total) by mouth every 6 (six) hours as needed for mild pain (or Fever >/= 101).     albuterol 108 (90 BASE) MCG/ACT inhaler  Commonly known as:  PROVENTIL HFA;VENTOLIN HFA  Inhale 2 puffs into the lungs every 6 (six) hours as needed for wheezing.     amLODipine 10 MG tablet  Commonly known as:  NORVASC  Take 10 mg by mouth daily.     amoxicillin-clavulanate 875-125 MG per tablet  Commonly known as:  AUGMENTIN  Take 1 tablet by mouth every 12 (twelve) hours.     aspirin EC 81 MG tablet  Take 81 mg by mouth daily.     atorvastatin 40 MG tablet  Commonly known as:  LIPITOR  Take 40 mg by mouth daily.     carvedilol 25  MG tablet  Commonly known as:  COREG  Take 25 mg by mouth 2 (two) times daily with a meal.     cloNIDine 0.1 MG tablet  Commonly known as:  CATAPRES  Take 1 tablet (0.1 mg total) by mouth daily.     ferrous sulfate 325 (65 FE) MG tablet  Take 325 mg by mouth daily with breakfast.     gabapentin 400 MG capsule  Commonly known as:  NEURONTIN  Take 800 mg by mouth 3 (three) times daily.     hydrALAZINE 100 MG tablet  Commonly known as:  APRESOLINE  Take 100 mg by mouth 3 (three) times daily.     lisinopril 40 MG tablet  Commonly known as:  PRINIVIL,ZESTRIL  Take 40 mg by mouth daily.     metFORMIN 1000 MG tablet  Commonly known as:  GLUCOPHAGE  Take 1,000 mg by mouth 2 (two) times daily with a meal.     nystatin cream  Commonly known as:  MYCOSTATIN  Apply 1 application topically 2 (two) times daily.     omeprazole 20 MG capsule  Commonly known as:  PRILOSEC  Take 20 mg by mouth 2 (two) times daily before a meal.     potassium chloride SA 20 MEQ tablet  Commonly known as:  K-DUR,KLOR-CON  Take 20 mEq by mouth 2 (two) times daily.     sertraline 100 MG tablet  Commonly known as:  ZOLOFT  Take 150 mg by mouth daily.           Total Time in preparing paper work, data evaluation and todays exam - 35 minutes  Auburn Bilberry M.D on 11/06/2014 at 11:41 AM  [facility]   Office  [phone]

## 2014-11-06 NOTE — Progress Notes (Signed)
Pt to be discharged today. Iv and tele removed. disch instructions and prescrip given to pt to her understanding. Awaiting sone to provide transport.

## 2014-11-06 NOTE — Discharge Instructions (Signed)
°  DIET:  Cardiac diet,diabetic diet  DISCHARGE CONDITION:  Stable  ACTIVITY:  Activity as tolerated  OXYGEN:  Home Oxygen: No.   Oxygen Delivery: room air  DISCHARGE LOCATION:  home    ADDITIONAL DISCHARGE INSTRUCTION: when getting up take your time get situated prior to getting up dont get up too quickly   If you experience worsening of your admission symptoms, develop shortness of breath, life threatening emergency, suicidal or homicidal thoughts you must seek medical attention immediately by calling 911 or calling your MD immediately  if symptoms less severe.  You Must read complete instructions/literature along with all the possible adverse reactions/side effects for all the Medicines you take and that have been prescribed to you. Take any new Medicines after you have completely understood and accpet all the possible adverse reactions/side effects.   Please note  You were cared for by a hospitalist during your hospital stay. If you have any questions about your discharge medications or the care you received while you were in the hospital after you are discharged, you can call the unit and asked to speak with the hospitalist on call if the hospitalist that took care of you is not available. Once you are discharged, your primary care physician will handle any further medical issues. Please note that NO REFILLS for any discharge medications will be authorized once you are discharged, as it is imperative that you return to your primary care physician (or establish a relationship with a primary care physician if you do not have one) for your aftercare needs so that they can reassess your need for medications and monitor your lab values.

## 2014-11-08 LAB — CULTURE, BLOOD (ROUTINE X 2)
Culture: NO GROWTH
Culture: NO GROWTH

## 2015-01-20 ENCOUNTER — Inpatient Hospital Stay: Payer: Medicaid Other

## 2015-01-20 ENCOUNTER — Emergency Department: Payer: Medicaid Other

## 2015-01-20 ENCOUNTER — Observation Stay
Admission: EM | Admit: 2015-01-20 | Discharge: 2015-01-22 | Disposition: A | Discharge status: Home / Self Care | Payer: Medicaid Other | Attending: Internal Medicine | Admitting: Internal Medicine

## 2015-01-20 ENCOUNTER — Inpatient Hospital Stay
Admit: 2015-01-20 | Discharge: 2015-01-20 | Disposition: A | Discharge status: Home / Self Care | Payer: Medicaid Other | Attending: Internal Medicine | Admitting: Internal Medicine

## 2015-01-20 DIAGNOSIS — R072 Precordial pain: Secondary | ICD-10-CM | POA: Diagnosis not present

## 2015-01-20 DIAGNOSIS — I10 Essential (primary) hypertension: Secondary | ICD-10-CM | POA: Diagnosis present

## 2015-01-20 DIAGNOSIS — N183 Chronic kidney disease, stage 3 (moderate): Secondary | ICD-10-CM | POA: Insufficient documentation

## 2015-01-20 DIAGNOSIS — B1920 Unspecified viral hepatitis C without hepatic coma: Secondary | ICD-10-CM | POA: Diagnosis not present

## 2015-01-20 DIAGNOSIS — Z87891 Personal history of nicotine dependence: Secondary | ICD-10-CM | POA: Insufficient documentation

## 2015-01-20 DIAGNOSIS — K7460 Unspecified cirrhosis of liver: Secondary | ICD-10-CM | POA: Diagnosis not present

## 2015-01-20 DIAGNOSIS — R079 Chest pain, unspecified: Secondary | ICD-10-CM | POA: Diagnosis present

## 2015-01-20 DIAGNOSIS — K219 Gastro-esophageal reflux disease without esophagitis: Secondary | ICD-10-CM | POA: Insufficient documentation

## 2015-01-20 DIAGNOSIS — I160 Hypertensive urgency: Secondary | ICD-10-CM

## 2015-01-20 DIAGNOSIS — E1122 Type 2 diabetes mellitus with diabetic chronic kidney disease: Secondary | ICD-10-CM | POA: Insufficient documentation

## 2015-01-20 DIAGNOSIS — I272 Other secondary pulmonary hypertension: Secondary | ICD-10-CM | POA: Insufficient documentation

## 2015-01-20 DIAGNOSIS — F329 Major depressive disorder, single episode, unspecified: Secondary | ICD-10-CM | POA: Insufficient documentation

## 2015-01-20 DIAGNOSIS — M7989 Other specified soft tissue disorders: Secondary | ICD-10-CM | POA: Diagnosis not present

## 2015-01-20 DIAGNOSIS — E876 Hypokalemia: Secondary | ICD-10-CM | POA: Diagnosis not present

## 2015-01-20 DIAGNOSIS — I517 Cardiomegaly: Secondary | ICD-10-CM | POA: Diagnosis not present

## 2015-01-20 DIAGNOSIS — R0602 Shortness of breath: Secondary | ICD-10-CM | POA: Diagnosis not present

## 2015-01-20 DIAGNOSIS — Z7982 Long term (current) use of aspirin: Secondary | ICD-10-CM | POA: Diagnosis not present

## 2015-01-20 DIAGNOSIS — I319 Disease of pericardium, unspecified: Secondary | ICD-10-CM | POA: Diagnosis present

## 2015-01-20 DIAGNOSIS — E119 Type 2 diabetes mellitus without complications: Secondary | ICD-10-CM

## 2015-01-20 DIAGNOSIS — N189 Chronic kidney disease, unspecified: Secondary | ICD-10-CM | POA: Diagnosis present

## 2015-01-20 DIAGNOSIS — R918 Other nonspecific abnormal finding of lung field: Secondary | ICD-10-CM | POA: Diagnosis not present

## 2015-01-20 DIAGNOSIS — I5032 Chronic diastolic (congestive) heart failure: Secondary | ICD-10-CM | POA: Diagnosis not present

## 2015-01-20 DIAGNOSIS — R531 Weakness: Secondary | ICD-10-CM | POA: Insufficient documentation

## 2015-01-20 DIAGNOSIS — M1990 Unspecified osteoarthritis, unspecified site: Secondary | ICD-10-CM | POA: Insufficient documentation

## 2015-01-20 DIAGNOSIS — I129 Hypertensive chronic kidney disease with stage 1 through stage 4 chronic kidney disease, or unspecified chronic kidney disease: Secondary | ICD-10-CM | POA: Insufficient documentation

## 2015-01-20 DIAGNOSIS — R609 Edema, unspecified: Secondary | ICD-10-CM

## 2015-01-20 LAB — BASIC METABOLIC PANEL
ANION GAP: 6 (ref 5–15)
BUN: 24 mg/dL — AB (ref 6–20)
CHLORIDE: 105 mmol/L (ref 101–111)
CO2: 28 mmol/L (ref 22–32)
Calcium: 8.9 mg/dL (ref 8.9–10.3)
Creatinine, Ser: 0.89 mg/dL (ref 0.44–1.00)
GFR calc Af Amer: >60 mL/min (ref >60)
GFR calc non Af Amer: >60 mL/min (ref >60)
GLUCOSE: 210 mg/dL — AB (ref 65–99)
POTASSIUM: 3.2 mmol/L — AB (ref 3.5–5.1)
Sodium: 139 mmol/L (ref 135–145)

## 2015-01-20 LAB — CBC
HCT: 33.3 % — ABNORMAL LOW (ref 35.0–47.0)
Hemoglobin: 10.8 g/dL — ABNORMAL LOW (ref 12.0–16.0)
MCH: 26.6 pg (ref 26.0–34.0)
MCHC: 32.5 g/dL (ref 32.0–36.0)
MCV: 81.9 fL (ref 80.0–100.0)
PLATELETS: 147 10*3/uL — AB (ref 150–440)
RBC: 4.06 MIL/uL (ref 3.80–5.20)
RDW: 15.2 % — ABNORMAL HIGH (ref 11.5–14.5)
WBC: 11.2 10*3/uL — ABNORMAL HIGH (ref 3.6–11.0)

## 2015-01-20 LAB — HEPATIC FUNCTION PANEL
ALK PHOS: 68 U/L (ref 38–126)
ALT: 15 U/L (ref 14–54)
AST: 20 U/L (ref 15–41)
Albumin: 3.3 g/dL — ABNORMAL LOW (ref 3.5–5.0)
BILIRUBIN TOTAL: 0.8 mg/dL (ref 0.3–1.2)
Total Protein: 7.4 g/dL (ref 6.5–8.1)

## 2015-01-20 LAB — FIBRIN DERIVATIVES D-DIMER (ARMC ONLY): Fibrin derivatives D-dimer (ARMC): 761 — ABNORMAL HIGH (ref 0–499)

## 2015-01-20 LAB — TROPONIN I
Troponin I: <0.03 ng/mL (ref <0.031)
Troponin I: <0.03 ng/mL (ref <0.031)
Troponin I: <0.03 ng/mL (ref <0.031)
Troponin I: <0.03 ng/mL (ref <0.031)

## 2015-01-20 LAB — GLUCOSE, CAPILLARY
GLUCOSE-CAPILLARY: 225 mg/dL — AB (ref 65–99)
Glucose-Capillary: 291 mg/dL — ABNORMAL HIGH (ref 65–99)
Glucose-Capillary: 163 mg/dL — ABNORMAL HIGH (ref 65–99)

## 2015-01-20 LAB — PROTIME-INR
INR: 0.98
PROTHROMBIN TIME: 13.2 s (ref 11.4–15.0)

## 2015-01-20 LAB — SEDIMENTATION RATE: Sed Rate: 58 mm/hr — ABNORMAL HIGH (ref 0–30)

## 2015-01-20 MED ORDER — IOHEXOL 350 MG/ML SOLN
75.0000 mL | Freq: Once | INTRAVENOUS | Status: AC | PRN
  Administered 2015-01-20: 75 mL via INTRAVENOUS

## 2015-01-20 MED ORDER — ATORVASTATIN CALCIUM 20 MG PO TABS
40.0000 mg | ORAL_TABLET | Freq: Every day | ORAL | Status: DC
Start: 2015-01-20 — End: 2015-01-22
  Administered 2015-01-20 – 2015-01-22 (×3): 40 mg via ORAL
  Filled 2015-01-20 (×3): qty 2

## 2015-01-20 MED ORDER — NITROGLYCERIN 0.4 MG SL SUBL
0.4000 mg | SUBLINGUAL_TABLET | SUBLINGUAL | Status: DC | PRN
  Administered 2015-01-20: 0.4 mg via SUBLINGUAL

## 2015-01-20 MED ORDER — MORPHINE SULFATE (PF) 2 MG/ML IV SOLN
2.0000 mg | INTRAVENOUS | Status: DC | PRN
  Filled 2015-01-20: qty 1

## 2015-01-20 MED ORDER — SODIUM CHLORIDE 0.9 % IJ SOLN
3.0000 mL | INTRAMUSCULAR | Status: DC | PRN
  Administered 2015-01-21: 3 mL via INTRAVENOUS
  Filled 2015-01-20: qty 10

## 2015-01-20 MED ORDER — ACETAMINOPHEN 325 MG PO TABS: 650.0000 mg | ORAL_TABLET | Freq: Four times a day (QID) | ORAL | Status: DC | PRN

## 2015-01-20 MED ORDER — ASPIRIN EC 81 MG PO TBEC: 81.0000 mg | DELAYED_RELEASE_TABLET | Freq: Every day | ORAL | Status: DC

## 2015-01-20 MED ORDER — NITROGLYCERIN 0.4 MG SL SUBL
SUBLINGUAL_TABLET | SUBLINGUAL | Status: AC
  Administered 2015-01-20: 0.4 mg via SUBLINGUAL
  Filled 2015-01-20: qty 1

## 2015-01-20 MED ORDER — SODIUM CHLORIDE 0.9 % IJ SOLN
3.0000 mL | Freq: Two times a day (BID) | INTRAMUSCULAR | Status: DC
  Administered 2015-01-20: 3 mL via INTRAVENOUS

## 2015-01-20 MED ORDER — KETOROLAC TROMETHAMINE 30 MG/ML IJ SOLN
30.0000 mg | Freq: Once | INTRAMUSCULAR | Status: AC
  Administered 2015-01-20: 30 mg via INTRAVENOUS
  Filled 2015-01-20: qty 1

## 2015-01-20 MED ORDER — KETOROLAC TROMETHAMINE 30 MG/ML IJ SOLN
30.0000 mg | Freq: Three times a day (TID) | INTRAMUSCULAR | Status: DC
  Administered 2015-01-20 – 2015-01-22 (×7): 30 mg via INTRAVENOUS
  Filled 2015-01-20 (×7): qty 1

## 2015-01-20 MED ORDER — HEPARIN SODIUM (PORCINE) 5000 UNIT/ML IJ SOLN
5000.0000 [IU] | Freq: Three times a day (TID) | INTRAMUSCULAR | Status: DC
  Administered 2015-01-20 – 2015-01-22 (×7): 5000 [IU] via SUBCUTANEOUS
  Filled 2015-01-20 (×7): qty 1

## 2015-01-20 MED ORDER — INSULIN ASPART 100 UNIT/ML ~~LOC~~ SOLN
0.0000 [IU] | Freq: Three times a day (TID) | SUBCUTANEOUS | Status: DC
  Administered 2015-01-20: 2 [IU] via SUBCUTANEOUS
  Administered 2015-01-20: 5 [IU] via SUBCUTANEOUS
  Administered 2015-01-21: 2 [IU] via SUBCUTANEOUS
  Administered 2015-01-21: 5 [IU] via SUBCUTANEOUS
  Administered 2015-01-22: 3 [IU] via SUBCUTANEOUS
  Administered 2015-01-22: 2 [IU] via SUBCUTANEOUS
  Filled 2015-01-20: qty 5
  Filled 2015-01-20 (×3): qty 2
  Filled 2015-01-20: qty 5
  Filled 2015-01-20: qty 3

## 2015-01-20 MED ORDER — PANTOPRAZOLE SODIUM 40 MG PO TBEC
40.0000 mg | DELAYED_RELEASE_TABLET | Freq: Every day | ORAL | Status: DC
  Administered 2015-01-20 – 2015-01-22 (×3): 40 mg via ORAL
  Filled 2015-01-20 (×3): qty 1

## 2015-01-20 MED ORDER — ALBUTEROL SULFATE HFA 108 (90 BASE) MCG/ACT IN AERS: 2.0000 | INHALATION_SPRAY | Freq: Four times a day (QID) | RESPIRATORY_TRACT | Status: DC | PRN

## 2015-01-20 MED ORDER — MORPHINE SULFATE (PF) 2 MG/ML IV SOLN
2.0000 mg | Freq: Once | INTRAVENOUS | Status: AC
  Administered 2015-01-20: 2 mg via INTRAVENOUS
  Filled 2015-01-20: qty 1

## 2015-01-20 MED ORDER — ONDANSETRON HCL 4 MG PO TABS: 4.0000 mg | ORAL_TABLET | Freq: Four times a day (QID) | ORAL | Status: DC | PRN

## 2015-01-20 MED ORDER — METOPROLOL TARTRATE 1 MG/ML IV SOLN
5.0000 mg | Freq: Once | INTRAVENOUS | Status: AC
  Administered 2015-01-20: 5 mg via INTRAVENOUS
  Filled 2015-01-20: qty 5

## 2015-01-20 MED ORDER — POTASSIUM CHLORIDE CRYS ER 20 MEQ PO TBCR
20.0000 meq | EXTENDED_RELEASE_TABLET | Freq: Two times a day (BID) | ORAL | Status: DC
  Administered 2015-01-20 – 2015-01-22 (×5): 20 meq via ORAL
  Filled 2015-01-20 (×5): qty 1

## 2015-01-20 MED ORDER — CLONIDINE HCL 0.1 MG PO TABS: 0.2000 mg | ORAL_TABLET | Freq: Every day | ORAL | Status: DC

## 2015-01-20 MED ORDER — CLONIDINE HCL 0.1 MG PO TABS
0.2000 mg | ORAL_TABLET | Freq: Two times a day (BID) | ORAL | Status: DC
  Administered 2015-01-20 – 2015-01-22 (×4): 0.2 mg via ORAL
  Filled 2015-01-20 (×4): qty 2

## 2015-01-20 MED ORDER — ONDANSETRON HCL 4 MG/2ML IJ SOLN: 4.0000 mg | Freq: Four times a day (QID) | INTRAMUSCULAR | Status: DC | PRN

## 2015-01-20 MED ORDER — AMLODIPINE BESYLATE 10 MG PO TABS
10.0000 mg | ORAL_TABLET | Freq: Every day | ORAL | Status: DC
  Administered 2015-01-20 – 2015-01-22 (×3): 10 mg via ORAL
  Filled 2015-01-20 (×3): qty 1

## 2015-01-20 MED ORDER — SODIUM CHLORIDE 0.9 % IV SOLN: 250.0000 mL | INTRAVENOUS | Status: DC | PRN

## 2015-01-20 MED ORDER — SERTRALINE HCL 50 MG PO TABS
150.0000 mg | ORAL_TABLET | Freq: Every day | ORAL | Status: DC
  Administered 2015-01-20 – 2015-01-22 (×3): 150 mg via ORAL
  Filled 2015-01-20 (×3): qty 3

## 2015-01-20 MED ORDER — ASPIRIN EC 81 MG PO TBEC
81.0000 mg | DELAYED_RELEASE_TABLET | Freq: Every day | ORAL | Status: DC
  Administered 2015-01-20 – 2015-01-22 (×3): 81 mg via ORAL
  Filled 2015-01-20 (×3): qty 1

## 2015-01-20 MED ORDER — CARVEDILOL 25 MG PO TABS
25.0000 mg | ORAL_TABLET | Freq: Two times a day (BID) | ORAL | Status: DC
  Administered 2015-01-20 – 2015-01-22 (×5): 25 mg via ORAL
  Filled 2015-01-20 (×5): qty 1

## 2015-01-20 MED ORDER — GABAPENTIN 400 MG PO CAPS
800.0000 mg | ORAL_CAPSULE | Freq: Three times a day (TID) | ORAL | Status: DC
  Administered 2015-01-20 – 2015-01-22 (×7): 800 mg via ORAL
  Filled 2015-01-20 (×7): qty 2

## 2015-01-20 MED ORDER — FERROUS SULFATE 325 (65 FE) MG PO TABS
325.0000 mg | ORAL_TABLET | Freq: Every day | ORAL | Status: DC
  Administered 2015-01-20 – 2015-01-22 (×3): 325 mg via ORAL
  Filled 2015-01-20 (×3): qty 1

## 2015-01-20 MED ORDER — COLCHICINE 0.6 MG PO TABS
0.6000 mg | ORAL_TABLET | Freq: Two times a day (BID) | ORAL | Status: DC
  Administered 2015-01-20 – 2015-01-22 (×5): 0.6 mg via ORAL
  Filled 2015-01-20 (×5): qty 1

## 2015-01-20 MED ORDER — CLONIDINE HCL 0.1 MG PO TABS
0.1000 mg | ORAL_TABLET | Freq: Every day | ORAL | Status: DC
  Administered 2015-01-20: 0.1 mg via ORAL
  Filled 2015-01-20: qty 1

## 2015-01-20 MED ORDER — HYDRALAZINE HCL 50 MG PO TABS
100.0000 mg | ORAL_TABLET | Freq: Three times a day (TID) | ORAL | Status: DC
Start: 2015-01-20 — End: 2015-01-22
  Administered 2015-01-20 – 2015-01-22 (×7): 100 mg via ORAL
  Filled 2015-01-20 (×7): qty 2

## 2015-01-20 MED ORDER — LISINOPRIL 20 MG PO TABS
40.0000 mg | ORAL_TABLET | Freq: Every day | ORAL | Status: DC
  Administered 2015-01-20 – 2015-01-22 (×3): 40 mg via ORAL
  Filled 2015-01-20 (×3): qty 2

## 2015-01-20 MED ORDER — ALBUTEROL SULFATE (2.5 MG/3ML) 0.083% IN NEBU: 2.5000 mg | INHALATION_SOLUTION | Freq: Four times a day (QID) | RESPIRATORY_TRACT | Status: DC | PRN

## 2015-01-20 NOTE — ED Notes (Signed)
According to EMS, pt has been experiencing centralized chest pain for the last 3 hours. Pt was given 1in of Nitro Past to left chest and x1  Aspirin.

## 2015-01-20 NOTE — Progress Notes (Signed)
[facility] [place] [facility]   PATIENT NAME: Jamie Austin    MR#:  [id number]  DATE OF BIRTH:  28-Aug-1955  SUBJECTIVE:  CHIEF COMPLAINT:   Chief Complaint  Patient presents with  . Chest Pain    Still have tightness in chest and some SOB.  REVIEW OF SYSTEMS:  CONSTITUTIONAL: No fever, fatigue or weakness.  EYES: No blurred or double vision.  EARS, NOSE, AND THROAT: No tinnitus or ear pain.  RESPIRATORY: No cough, shortness of breath, wheezing or hemoptysis.  CARDIOVASCULAR: No chest pain, orthopnea, edema.  GASTROINTESTINAL: No nausea, vomiting, diarrhea or abdominal pain.  GENITOURINARY: No dysuria, hematuria.  ENDOCRINE: No polyuria, nocturia,  HEMATOLOGY: No anemia, easy bruising or bleeding SKIN: No rash or lesion. MUSCULOSKELETAL: No joint pain or arthritis. Swelling on legs.   NEUROLOGIC: No tingling, numbness, weakness.  PSYCHIATRY: No anxiety or depression.   ROS  DRUG ALLERGIES:  No Known Allergies  VITALS:  Blood pressure 133/58, pulse 76, temperature 98 F (36.7 C), temperature source Oral, resp. rate 22, height  (1.575 m), weight 96.435 kg (212 lb 9.6 oz), SpO2 97 %.  PHYSICAL EXAMINATION:  GENERAL:  59 y.o.-year-old patient lying in the bed with no acute distress.  EYES: Pupils equal, round, reactive to light and accommodation. No scleral icterus. Extraocular muscles intact.  HEENT: Head atraumatic, normocephalic. Oropharynx and nasopharynx clear.  NECK:  Supple, no jugular venous distention. No thyroid enlargement, no tenderness.  LUNGS: Normal breath sounds bilaterally, no wheezing, rales,rhonchi or crepitation. No use of accessory muscles of respiration.  CARDIOVASCULAR: S1, S2 normal. No murmurs, rubs, or gallops.  ABDOMEN: Soft, nontender, nondistended. Bowel sounds present. No organomegaly or mass.  EXTREMITIES: positive for pedal edema,  No cyanosis, or clubbing.  NEUROLOGIC: Cranial nerves II through XII are  intact. Muscle strength 4/5 in all extremities. Sensation intact. Gait not checked.  PSYCHIATRIC: The patient is alert and oriented x 3.  SKIN: No obvious rash, lesion, or ulcer.   Physical Exam LABORATORY PANEL:   CBC  Recent Labs Lab 01/20/15 0145  WBC 11.2*  HGB 10.8*  HCT 33.3*  PLT 147*   ------------------------------------------------------------------------------------------------------------------  Chemistries   Recent Labs Lab 01/20/15 0209 01/20/15 1034  NA 139  --   K 3.2*  --   CL 105  --   CO2 28  --   GLUCOSE 210*  --   BUN 24*  --   CREATININE 0.89  --   CALCIUM 8.9  --   AST  --  20  ALT  --  15  ALKPHOS  --  68  BILITOT  --  0.8   ------------------------------------------------------------------------------------------------------------------  Cardiac Enzymes  Recent Labs Lab 01/20/15 1034 01/20/15 1419  TROPONINI <0.03 <0.03   ------------------------------------------------------------------------------------------------------------------  RADIOLOGY:  Dg Chest 2 View  01/20/2015   CLINICAL DATA:  Chest pain  EXAM: CHEST  2 VIEW  COMPARISON:  11/03/2014  FINDINGS: Chronic cardiomegaly; stable mediastinal contours. There is no edema, consolidation, effusion, or pneumothorax. No osseous findings to explain acute chest pain.  IMPRESSION: Chronic cardiomegaly.  No evidence of acute disease.   Electronically Signed   By: Marnee Spring M.D.   On: 01/20/2015 02:58   Ct Angio Chest Pe W/cm &/or Wo Cm  01/20/2015   CLINICAL DATA:  Chest pain with inspiration.  EXAM: CT ANGIOGRAPHY CHEST WITH CONTRAST  TECHNIQUE: Multidetector CT imaging of the chest was performed using the standard protocol during bolus administration of intravenous contrast.  Multiplanar CT image reconstructions and MIPs were obtained to evaluate the vascular anatomy.  CONTRAST:  75mL OMNIPAQUE IOHEXOL 350 MG/ML SOLN  COMPARISON:  None.  FINDINGS: THORACIC INLET/BODY WALL:  No acute  abnormality.  MEDIASTINUM:  Cardiomegaly, chronic by radiography. Trace pericardial fluid or thickening without definite enhancement.  Enlarged main pulmonary artery measuring 36 mm in diameter. No evidence of pulmonary embolism. No acute aortic finding.  LUNG WINDOWS:  No consolidation.  No effusion.  Two closely positioned nodules in the right middle lobe, measuring up to 6 mm individually.  UPPER ABDOMEN:  Large caudate lobe, hepatic fissures, with ventral left lobe nodularity. In this patient with hepatitis-C, these are findings of cirrhosis.  OSSEOUS:  No acute fracture.  No suspicious lytic or blastic lesions.  Review of the MIP images confirms the above findings.  IMPRESSION: 1. No evidence of pulmonary embolism. 2. Mild pericardial thickening/fluid. Could patient's symptoms be pericarditis? 3. Cirrhotic liver morphology and history of hepatitis-C. Recommend GI referral. 4. Right middle lobe pulmonary nodules, with clustering favoring a postinflammatory process, up to 6 mm. Given smoking history, follow-up chest CT at 6 - 12 months is recommended. This recommendation follows the consensus statement: Guidelines for Management of Small Pulmonary Nodules Detected on CT Scans: A Statement from the Fleischner Society as published in Radiology 2005;237:395-400. 5. Chronic cardiomegaly. Dilated main pulmonary artery as seen with pulmonary hypertension.   Electronically Signed   By: Marnee Spring M.D.   On: 01/20/2015 03:41    ASSESSMENT AND PLAN:   59 year old Caucasian female with a history of hypertension, diabetes mellitus type 2, hep C, CK D stage 3, diastolic congestive heart failure presents to the emergency room with the complaints of acute onset of retrosternal chest pain which is sharp in character, no radiation of pain and increases with respiration and chest wall movements, CT chest negative for pulmonary embolism but positive for pericardial thickening/fluid concerning for pericarditis. 1.  Retrosternal chest pain,   sharp in character, increasing with respiration and chest wall movement. CT chest negative for pulmonary embolism but positive for pericardial thickening/fluid concerning for pericarditis. Troponin  WNL.   Sedimentation rate elevated telemetry monitoring, IV pain control medications including Toradol and morphine, cycle cardiac enzymes. Order 2-D echocardiogram and cardiology consultation for further evaluation. 2. Hypertension, not optimally controlled. Likely secondary to pain. Patient on multiple anti-hypertensives. Continue home medications, pain control medications. Follow BP measurements. 3. Diabetes mellitus type 2, stable clinically. Hold metformin because of IV contrast. Sliding scale insulin follow-up blood sugars closely.  4. CK D stage III. Creatinine now 0.89. No acute problems. Monitor 5. History of diastolic congestive heart failure, stable clinically.  6. Depression, stable on home medications. Continue same. 7. History of hep C, stable clinically. Check LFTs. Follow-up accordingly. 8. GERD, stable on PPI. Continue same. 9. Generalized weakness, falls, leg swellings.    Will get PT eval and Doppler studies of Lower extrimities.  All the records are reviewed and case discussed with Care Management/Social Workerr. Management plans discussed with the patient, family and they are in agreement.  CODE STATUS: full  TOTAL TIME TAKING CARE OF THIS PATIENT: 35 minutes.    POSSIBLE D/C IN 1-2 DAYS, DEPENDING ON CLINICAL CONDITION.   Altamese Dilling M.D on 01/20/2015   Between 7am to 6pm - Pager - [phone]  After 6pm go to [email or web address] - password [email or web address]  [facility]  Office  [phone]  CC: Primary care physician; WHITE, Arlyss Repress, NP  Note: This  dictation was prepared with Dragon dictation along with smaller phrase technology. Any transcriptional errors that result from this process are unintentional.

## 2015-01-20 NOTE — Progress Notes (Signed)
*  PRELIMINARY RESULTS* °Echocardiogram °2D Echocardiogram has been performed. ° °Jamie Austin °01/20/2015, 10:34 AM °

## 2015-01-20 NOTE — Consult Note (Signed)
[facility]ARDIOLOG[facility]  CARDIOLOGY CONSULT NOTE  Patient ID: Jamie Austin MRN: [id number] DOB/AGE: 07/30/1955 59 y.o.  Admit date: 01/20/2015 Referring Physician vachhani Primary Physician   Primary Cardiologist   Reason for Consultation chest pain  HPI: Patient is a 59 year old female who is a very difficult historian but apparently presented to the hospital with complaints of chest pain.  Pain is worse when she breathes deep or lays on her side.  She has a history of chronic kidney disease stage 3, diabetes mellitus, hypertension, hepatitis-C, chronic diastolic congestive heart failure.  She states the pain occurred at rest.  There is no radiation.  He has not associated to nausea vomiting or diaphoresis.  She has been given some pain medications with some relief.  Her D-dimer was 761 with this at great of 57.  She has ruled out for myocardial infarction with a normal troponin.  Chest  CT scan revealed no evidence of pulmonary embolism or consolidation but pericardial thickening.  Echocardiogram done today revealed normal LV function with no significant regional wall motion abnormality.  There was trivial pericardial fusion but no evidence of tamponade.  He EKG sinus rhythm with nonspecific ST T wave changes.  ROS Review of Systems - History obtained from chart review and the patient General ROS: positive for  - fatigue and Chest pain Respiratory ROS: positive for - shortness of breath Cardiovascular ROS: positive for - chest pain Gastrointestinal ROS: no abdominal pain, change in bowel habits, or black or bloody stools Neurological ROS: no TIA or stroke symptoms   Past Medical History  Diagnosis Date  . Hypertension   . Diabetes mellitus without complication   . Anemia   . GERD (gastroesophageal reflux disease)   . Arthritis   . Depression   . Hepatitis C   . Hypokalemia   . Diverticulitis   . Bronchitis   . Renal disorder   . Back pain   . CHF  (congestive heart failure)     Family History  Problem Relation Age of Onset  . Cancer Mother   . Coronary artery disease Father     Social History   Social History  . Marital Status: Legally Separated    Spouse Name: N/A  . Number of Children: N/A  . Years of Education: N/A   Occupational History  . Not on file.   Social History Main Topics  . Smoking status: Former Smoker    Quit date: 10/06/2009  . Smokeless tobacco: Not on file  . Alcohol Use: No  . Drug Use: No  . Sexual Activity: Not on file   Other Topics Concern  . Not on file   Social History Narrative    Past Surgical History  Procedure Laterality Date  . Cesarean section    . Abdominal hysterectomy    . Small intestine surgery N/A     Complication of hysterectomy     Prescriptions prior to admission  Medication Sig Dispense Refill Last Dose  . acetaminophen (TYLENOL) 325 MG tablet Take 2 tablets (650 mg total) by mouth every 6 (six) hours as needed for mild pain (or Fever >/= 101). 1 tablet 0 PRN at PRN  . albuterol (PROVENTIL HFA;VENTOLIN HFA) 108 (90 BASE) MCG/ACT inhaler Inhale 2 puffs into the lungs every 6 (six) hours as needed for wheezing.    unknown at unknown  . amLODipine (NORVASC) 10 MG tablet Take 10 mg by mouth daily.   unknown at unknown  . aspirin  EC 81 MG tablet Take 81 mg by mouth daily.   unknown at Unknown time  . atorvastatin (LIPITOR) 40 MG tablet Take 40 mg by mouth daily.   unknwn at Unknown time  . carvedilol (COREG) 25 MG tablet Take 25 mg by mouth 2 (two) times daily with a meal.   unknown at Unknown time  . cloNIDine (CATAPRES) 0.1 MG tablet Take 1 tablet (0.1 mg total) by mouth daily. 60 tablet 11 01/19/2015 at Unknown time  . ferrous sulfate 325 (65 FE) MG tablet Take 325 mg by mouth daily with breakfast.   unknown at Unknown time  . gabapentin (NEURONTIN) 400 MG capsule Take 800 mg by mouth 3 (three) times daily.   unknown at Unknown time  . hydrALAZINE (APRESOLINE) 100 MG  tablet Take 100 mg by mouth 3 (three) times daily.   unknown at Unknown time  . lisinopril (PRINIVIL,ZESTRIL) 40 MG tablet Take 40 mg by mouth daily.   unknown at Unknown time  . metFORMIN (GLUCOPHAGE) 1000 MG tablet Take 1,000 mg by mouth 2 (two) times daily with a meal.   unknown at Unknown time  . omeprazole (PRILOSEC) 20 MG capsule Take 20 mg by mouth 2 (two) times daily before a meal.   unknown at Unknown time  . potassium chloride SA (K-DUR,KLOR-CON) 20 MEQ tablet Take 20 mEq by mouth 2 (two) times daily.   unknown at Unknown time  . sertraline (ZOLOFT) 100 MG tablet Take 150 mg by mouth daily.   unknown at Unknown time    Physical Exam: Blood pressure 133/58, pulse 76, temperature 98 F (36.7 C), temperature source Oral, resp. rate 22, height  (1.575 m), weight 96.435 kg (212 lb 9.6 oz), SpO2 97 %.    General appearance: cooperative and slowed mentation Resp: clear to auscultation bilaterally Cardio: regular rate and rhythm GI: soft, non-tender; bowel sounds normal; no masses,  no organomegaly Extremities: extremities normal, atraumatic, no cyanosis or edema Neurologic: Grossly normal Labs:   Lab Results  Component Value Date   WBC 11.2* 01/20/2015   HGB 10.8* 01/20/2015   HCT 33.3* 01/20/2015   MCV 81.9 01/20/2015   PLT 147* 01/20/2015    Recent Labs Lab 01/20/15 0209 01/20/15 1034  NA 139  --   K 3.2*  --   CL 105  --   CO2 28  --   BUN 24*  --   CREATININE 0.89  --   CALCIUM 8.9  --   PROT  --  7.4  BILITOT  --  0.8  ALKPHOS  --  68  ALT  --  15  AST  --  20  GLUCOSE 210*  --    Lab Results  Component Value Date   TROPONINI <0.03 01/20/2015      Radiology:   Chest CT revealed pericardial thickening.  Chest x-ray revealed no significant abnormalities. EKG:   Normal sinus rhythm with no ischemia  ASSESSMENT AND PLAN:   Patient with chest pain which is atypical for angina.  Chest CT suggested possible pericarditis.  Echocardiogram does not give a  diagnosis of pericarditis with there is trivial pericardial fusion.  Her symptoms do have features of pericardial involvement.  D-dimer is elevated sed rate is mildly elevated.  There is no rub clinically.  Will treat with anti-inflammatory culture seen is 0.6 mg twice daily and see if this improves her symptoms. Signed: Dalia Heading MD, [facility] 01/20/2015, 4:10 PM

## 2015-01-20 NOTE — ED Provider Notes (Signed)
[facility] Emergency Department Provider Note  ____________________________________________  Time seen: Approximately 1:21 AM  I have reviewed the triage vital signs and the nursing notes.   HISTORY  Chief Complaint Chest Pain    HPI Jamie Austin is a 59 y.o. female who comes into the hospital tonight with chest pain, shoulder pain and neck pain. The patient reports that she has some pain in her shoulder and neck around 9 PM on the left side. The patient did not take anything for pain. She reports that the chest pain started at 11 PM. The patient has had chest pain before but reports it has not been this bad in the past. The patient reports that it does hurt to breathe and that is really bad 10 out of 10 pain. The patient denies any nausea vomiting dizziness or lightheadedness. She reports that she has some mild pain in her back with no abdominal pain. The patient reports she is unable to tolerate the pain she came in for further evaluation of this chest pain.   Past Medical History  Diagnosis Date  . Hypertension   . Diabetes mellitus without complication   . Anemia   . GERD (gastroesophageal reflux disease)   . Arthritis   . Depression   . Hepatitis C   . Hypokalemia   . Diverticulitis   . Bronchitis   . Renal disorder   . Back pain   . CHF (congestive heart failure)     Patient Active Problem List   Diagnosis Date Noted  . Chest pain 01/20/2015  . Pericarditis 01/20/2015  . HTN (hypertension) 01/20/2015  . DM (diabetes mellitus) 01/20/2015  . CKD (chronic kidney disease) 01/20/2015  . 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 Near syncope 11/03/2014  . Elevated troponin 11/03/2014    Past Surgical History  Procedure Laterality Date  . Cesarean section    . Abdominal hysterectomy    . Small intestine surgery N/A     Complication of hysterectomy    Current Outpatient Rx  Name  Route  Sig  Dispense   Refill  . acetaminophen (TYLENOL) 325 MG tablet   Oral   Take 2 tablets (650 mg total) by mouth every 6 (six) hours as needed for mild pain (or Fever >/= 101).   1 tablet   0   . albuterol (PROVENTIL HFA;VENTOLIN HFA) 108 (90 BASE) MCG/ACT inhaler   Inhalation   Inhale 2 puffs into the lungs every 6 (six) hours as needed for wheezing.          Marland Kitchen amLODipine (NORVASC) 10 MG tablet   Oral   Take 10 mg by mouth daily.         Marland Kitchen aspirin EC 81 MG tablet   Oral   Take 81 mg by mouth daily.         Marland Kitchen atorvastatin (LIPITOR) 40 MG tablet   Oral   Take 40 mg by mouth daily.         . carvedilol (COREG) 25 MG tablet   Oral   Take 25 mg by mouth 2 (two) times daily with a meal.         . cloNIDine (CATAPRES) 0.1 MG tablet   Oral   Take 1 tablet (0.1 mg total) by mouth daily.   60 tablet   11   . ferrous sulfate 325 (65 FE) MG tablet   Oral   Take 325 mg by mouth daily with breakfast.         .  gabapentin (NEURONTIN) 400 MG capsule   Oral   Take 800 mg by mouth 3 (three) times daily.         . hydrALAZINE (APRESOLINE) 100 MG tablet   Oral   Take 100 mg by mouth 3 (three) times daily.         Marland Kitchen lisinopril (PRINIVIL,ZESTRIL) 40 MG tablet   Oral   Take 40 mg by mouth daily.         . metFORMIN (GLUCOPHAGE) 1000 MG tablet   Oral   Take 1,000 mg by mouth 2 (two) times daily with a meal.         . omeprazole (PRILOSEC) 20 MG capsule   Oral   Take 20 mg by mouth 2 (two) times daily before a meal.         . potassium chloride SA (K-DUR,KLOR-CON) 20 MEQ tablet   Oral   Take 20 mEq by mouth 2 (two) times daily.         . sertraline (ZOLOFT) 100 MG tablet   Oral   Take 150 mg by mouth daily.           Allergies Review of patient's allergies indicates no known allergies.  Family History  Problem Relation Age of Onset  . Cancer Mother   . Coronary artery disease Father     Social History Social History  Substance Use Topics  . Smoking  status: Former Smoker    Quit date: 10/06/2009  . Smokeless tobacco: None  . Alcohol Use: No    Review of Systems Constitutional: No fever/chills Eyes: No visual changes. ENT: No sore throat. Cardiovascular:  chest pain. Respiratory: shortness of breath. Gastrointestinal: No abdominal pain.  No nausea, no vomiting.  No diarrhea.  No constipation. Genitourinary: Negative for dysuria. Musculoskeletal:  back pain and shoulder pain Skin: Negative for rash. Neurological: Negative for headaches, focal weakness or numbness.  10-point ROS otherwise negative.  ____________________________________________   PHYSICAL EXAM:  VITAL SIGNS: ED Triage Vitals  Enc Vitals Group     BP --      Pulse --      Resp --      Temp --      Temp src --      SpO2 --      Weight --      Height --      Head Cir --      Peak Flow --      Pain Score --      Pain Loc --      Pain Edu? --      Excl. in GC? --     Constitutional: Alert and oriented. Well appearing and in moderate distress. Eyes: Conjunctivae are normal. PERRL. EOMI. Head: Atraumatic. Nose: No congestion/rhinnorhea. Mouth/Throat: Mucous membranes are moist.  Oropharynx non-erythematous. Cardiovascular: Normal rate, regular rhythm. Grossly normal heart sounds.  Good peripheral circulation. Respiratory: Normal respiratory effort.  No retractions. Lungs CTAB. Gastrointestinal: Soft and nontender. No distention. Positive bowel sounds Musculoskeletal: No lower extremity tenderness nor edema.   Neurologic:  Normal speech and language. Skin:  Skin is warm, dry and intact.  Psychiatric: Mood and affect are normal.   ____________________________________________   LABS (all labs ordered are listed, but only abnormal results are displayed)  Labs Reviewed  CBC - Abnormal; Notable for the following:    WBC 11.2 (*)    Hemoglobin 10.8 (*)    HCT 33.3 (*)    RDW 15.2 (*)  Platelets 147 (*)    All other components within normal  limits  FIBRIN DERIVATIVES D-DIMER ([facility] ONLY) - Abnormal; Notable for the following:    Fibrin derivatives D-dimer ([facility]) 761 (*)    All other components within normal limits  BASIC METABOLIC PANEL - Abnormal; Notable for the following:    Potassium 3.2 (*)    Glucose, Bld 210 (*)    BUN 24 (*)    All other components within normal limits  SEDIMENTATION RATE - Abnormal; Notable for the following:    Sed Rate 58 (*)    All other components within normal limits  PROTIME-INR  TROPONIN I  TROPONIN I   ____________________________________________  EKG  ED ECG REPORT I, Rebecka Apley, the attending physician, personally viewed and interpreted this ECG.   Date: 01/20/2015  EKG Time: 125  Rate: 76  Rhythm: normal sinus rhythm  Axis: left axis  Intervals:none  ST&T Change: none  ____________________________________________  RADIOLOGY  CXR: Chronic cardiomegaly, no evidence of acute disease CT chest: No evidence of PE, mild pericardial thickening/fluid, cirrhotic liver morphology, right middle lobe pulmonary nodules ____________________________________________   PROCEDURES  Procedure(s) performed: None  Critical Care performed: No  ____________________________________________   INITIAL IMPRESSION / ASSESSMENT AND PLAN / ED COURSE  Pertinent labs & imaging results that were available during my care of the patient were reviewed by me and considered in my medical decision making (see chart for details).  This is 59 year old female who comes in today with chest pain. The patient had nitro paste given to her by EMS as well as one aspirin. We will continue to evaluate the patient. I will give her some morphine for her pain and continued to assess her once I received some blood work and x-ray results.  The patient received 2 doses of IV Lopressor as well as some morphine and Toradol for her pain. The patient continued to be hypertensive on the emergency department and  continued to complain of 9 out of 10 pain. Although her initial troponin is negative I will admit the patient to the hospital for further evaluation for pericarditis and hypertensive urgency. ____________________________________________   FINAL CLINICAL IMPRESSION(S) / ED DIAGNOSES  Final diagnoses:  Chest pain, unspecified chest pain type  Pericarditis  Hypertensive urgency      Rebecka Apley, MD 01/20/15 [phone]

## 2015-01-20 NOTE — H&P (Signed)
[facility] - [place] at [facility]   PATIENT NAME: Jamie Austin    MR#:  [id number]  DATE OF BIRTH:  February 12, 1956  DATE OF ADMISSION:  01/20/2015  PRIMARY CARE PHYSICIAN: WHITE, Arlyss Repress, NP   REQUESTING/REFERRING PHYSICIAN: Dr. Zenda Alpers  CHIEF COMPLAINT:   Chief Complaint  Patient presents with  . Chest Pain    HISTORY OF PRESENT ILLNESS:  Chantell Kunkler  is a 59 y.o. female with a known history of hypertension, diabetes mellitus type 2, CK D stage III, hep C, GERD, chronic diastolic congestive heart failure, depression presents to the emergency room with the complaints of acute onset of retrosternal chest pain which started around 11 PM last night while watching television. No radiation of chest pain. Chest pain sharp in character and not associated with any shortness of breath, palpitations, dizziness. Chest pain increases on deep inspiration and movements of the chest. Denies any fever, cough, nausea, vomiting,abdominal pain, dysuria. Evaluation in the ED revealed elevated systolic blood pressure in the range of 180. EKG normal sinus rhythm with ventricular rate of 76 bpm, LAD. Chest x-ray unremarkable for any acute cardiopulmonary pathology. Lab work showed elevated WBC of 11.2, H&H 10.08/33.3, potassium 3.2, BUN/creatinine 24/0.89, troponin less than 0.03, elevated sedimentation rate of 58, elevated d-dimer of 761. Patient underwent CT chest for PE which is reported negative for pulmonary embolism, no consolidation, no effusion but mild pericardial thickening/fluid concerning for pericarditis. Patient was given IV Toradol and IV metoprolol but continues to have significant retrosternal chest pain hence hospitalist service was consulted for further management. Patient denies similar symptoms in the past. No recent URI infection.  PAST MEDICAL HISTORY:   Past Medical History  Diagnosis Date  . Hypertension   . Diabetes mellitus without complication   .  Anemia   . GERD (gastroesophageal reflux disease)   . Arthritis   . Depression   . Hepatitis C   . Hypokalemia   . Diverticulitis   . Bronchitis   . Renal disorder   . Back pain   . CHF (congestive heart failure)     PAST SURGICAL HISTORY:   Past Surgical History  Procedure Laterality Date  . Cesarean section    . Abdominal hysterectomy    . Small intestine surgery N/A     Complication of hysterectomy    SOCIAL HISTORY:   Social History  Substance Use Topics  . Smoking status: Former Smoker    Quit date: 10/06/2009  . Smokeless tobacco: Not on file  . Alcohol Use: No    FAMILY HISTORY:   Family History  Problem Relation Age of Onset  . Cancer Mother   . Coronary artery disease Father     DRUG ALLERGIES:  No Known Allergies  REVIEW OF SYSTEMS:   Review of Systems  Constitutional: Negative for fever, chills and malaise/fatigue.  HENT: Negative for ear pain, hearing loss, nosebleeds, sore throat and tinnitus.   Eyes: Negative for blurred vision, double vision, pain, discharge and redness.  Respiratory: Negative for cough, hemoptysis, sputum production, shortness of breath and wheezing.   Cardiovascular: Positive for chest pain. Negative for palpitations, orthopnea and leg swelling.  Gastrointestinal: Negative for nausea, vomiting, abdominal pain, diarrhea, constipation, blood in stool and melena.  Genitourinary: Negative for dysuria, urgency, frequency and hematuria.  Musculoskeletal: Negative for back pain, joint pain and neck pain.  Skin: Negative for itching and rash.  Neurological: Negative for dizziness, tingling, sensory change, focal weakness and seizures.  Endo/Heme/Allergies: Does  not bruise/bleed easily.  Psychiatric/Behavioral: Negative for depression. The patient is not nervous/anxious.     MEDICATIONS AT HOME:   Prior to Admission medications   Medication Sig Start Date End Date Taking? Authorizing Provider  acetaminophen (TYLENOL) 325 MG  tablet Take 2 tablets (650 mg total) by mouth every 6 (six) hours as needed for mild pain (or Fever >/= 101). 11/06/14  Yes Auburn Bilberry, MD  albuterol (PROVENTIL HFA;VENTOLIN HFA) 108 (90 BASE) MCG/ACT inhaler Inhale 2 puffs into the lungs every 6 (six) hours as needed for wheezing.    Yes Historical Provider, MD  amLODipine (NORVASC) 10 MG tablet Take 10 mg by mouth daily.   Yes Historical Provider, MD  aspirin EC 81 MG tablet Take 81 mg by mouth daily.   Yes Historical Provider, MD  atorvastatin (LIPITOR) 40 MG tablet Take 40 mg by mouth daily.   Yes Historical Provider, MD  carvedilol (COREG) 25 MG tablet Take 25 mg by mouth 2 (two) times daily with a meal.   Yes Historical Provider, MD  cloNIDine (CATAPRES) 0.1 MG tablet Take 1 tablet (0.1 mg total) by mouth daily. 11/06/14  Yes Auburn Bilberry, MD  ferrous sulfate 325 (65 FE) MG tablet Take 325 mg by mouth daily with breakfast.   Yes Historical Provider, MD  gabapentin (NEURONTIN) 400 MG capsule Take 800 mg by mouth 3 (three) times daily.   Yes Historical Provider, MD  hydrALAZINE (APRESOLINE) 100 MG tablet Take 100 mg by mouth 3 (three) times daily.   Yes Historical Provider, MD  lisinopril (PRINIVIL,ZESTRIL) 40 MG tablet Take 40 mg by mouth daily.   Yes Historical Provider, MD  metFORMIN (GLUCOPHAGE) 1000 MG tablet Take 1,000 mg by mouth 2 (two) times daily with a meal.   Yes Historical Provider, MD  omeprazole (PRILOSEC) 20 MG capsule Take 20 mg by mouth 2 (two) times daily before a meal.   Yes Historical Provider, MD  potassium chloride SA (K-DUR,KLOR-CON) 20 MEQ tablet Take 20 mEq by mouth 2 (two) times daily.   Yes Historical Provider, MD  sertraline (ZOLOFT) 100 MG tablet Take 150 mg by mouth daily.   Yes Historical Provider, MD      VITAL SIGNS:  Blood pressure 189/90, pulse 72, temperature 97.9 F (36.6 C), temperature source Oral, resp. rate 18, SpO2 99 %.  PHYSICAL EXAMINATION:  Physical Exam  Constitutional: She is oriented to  person, place, and time. She appears well-developed and well-nourished. She appears distressed (Moderate distress because of retrosternal chest pain).  HENT:  Head: Normocephalic and atraumatic.  Right Ear: External ear normal.  Left Ear: External ear normal.  Nose: Nose normal.  Mouth/Throat: Oropharynx is clear and moist. No oropharyngeal exudate.  Eyes: EOM are normal. Pupils are equal, round, and reactive to light. No scleral icterus.  Neck: Normal range of motion. Neck supple. No JVD present. No thyromegaly present.  Cardiovascular: Normal rate, regular rhythm, normal heart sounds and intact distal pulses.  Exam reveals no friction rub.   No murmur heard. Respiratory: Effort normal and breath sounds normal. No respiratory distress. She has no wheezes. She has no rales. She exhibits no tenderness.  No chest wall tenderness  GI: Soft. Bowel sounds are normal. She exhibits no distension and no mass. There is no tenderness. There is no rebound and no guarding.  Musculoskeletal: Normal range of motion. She exhibits no edema.  Lymphadenopathy:    She has no cervical adenopathy.  Neurological: She is alert and oriented to person, place,  and time. She has normal reflexes. She displays normal reflexes. No cranial nerve deficit. She exhibits normal muscle tone.  Skin: Skin is warm. No rash noted. No erythema.  Psychiatric: She has a normal mood and affect. Her behavior is normal. Thought content normal.   LABORATORY PANEL:   CBC  Recent Labs Lab 01/20/15 0145  WBC 11.2*  HGB 10.8*  HCT 33.3*  PLT 147*   ------------------------------------------------------------------------------------------------------------------  Chemistries   Recent Labs Lab 01/20/15 0209  NA 139  K 3.2*  CL 105  CO2 28  GLUCOSE 210*  BUN 24*  CREATININE 0.89  CALCIUM 8.9   ------------------------------------------------------------------------------------------------------------------  Cardiac  Enzymes  Recent Labs Lab 01/20/15 0520  TROPONINI <0.03   ------------------------------------------------------------------------------------------------------------------  RADIOLOGY:  Dg Chest 2 View  01/20/2015   CLINICAL DATA:  Chest pain  EXAM: CHEST  2 VIEW  COMPARISON:  11/03/2014  FINDINGS: Chronic cardiomegaly; stable mediastinal contours. There is no edema, consolidation, effusion, or pneumothorax. No osseous findings to explain acute chest pain.  IMPRESSION: Chronic cardiomegaly.  No evidence of acute disease.   Electronically Signed   By: Marnee Spring M.D.   On: 01/20/2015 02:58   Ct Angio Chest Pe W/cm &/or Wo Cm  01/20/2015   CLINICAL DATA:  Chest pain with inspiration.  EXAM: CT ANGIOGRAPHY CHEST WITH CONTRAST  TECHNIQUE: Multidetector CT imaging of the chest was performed using the standard protocol during bolus administration of intravenous contrast. Multiplanar CT image reconstructions and MIPs were obtained to evaluate the vascular anatomy.  CONTRAST:  75mL OMNIPAQUE IOHEXOL 350 MG/ML SOLN  COMPARISON:  None.  FINDINGS: THORACIC INLET/BODY WALL:  No acute abnormality.  MEDIASTINUM:  Cardiomegaly, chronic by radiography. Trace pericardial fluid or thickening without definite enhancement.  Enlarged main pulmonary artery measuring 36 mm in diameter. No evidence of pulmonary embolism. No acute aortic finding.  LUNG WINDOWS:  No consolidation.  No effusion.  Two closely positioned nodules in the right middle lobe, measuring up to 6 mm individually.  UPPER ABDOMEN:  Large caudate lobe, hepatic fissures, with ventral left lobe nodularity. In this patient with hepatitis-C, these are findings of cirrhosis.  OSSEOUS:  No acute fracture.  No suspicious lytic or blastic lesions.  Review of the MIP images confirms the above findings.  IMPRESSION: 1. No evidence of pulmonary embolism. 2. Mild pericardial thickening/fluid. Could patient's symptoms be pericarditis? 3. Cirrhotic liver morphology  and history of hepatitis-C. Recommend GI referral. 4. Right middle lobe pulmonary nodules, with clustering favoring a postinflammatory process, up to 6 mm. Given smoking history, follow-up chest CT at 6 - 12 months is recommended. This recommendation follows the consensus statement: Guidelines for Management of Small Pulmonary Nodules Detected on CT Scans: A Statement from the Fleischner Society as published in Radiology 2005;237:395-400. 5. Chronic cardiomegaly. Dilated main pulmonary artery as seen with pulmonary hypertension.   Electronically Signed   By: Marnee Spring M.D.   On: 01/20/2015 03:41    EKG:   Orders placed or performed during the hospital encounter of 01/20/15  . EKG 12-Lead  . EKG 12-Lead  . ED EKG  . ED EKG  Normal sinus rhythm with ventricular rate of 76 bpm, LAD  IMPRESSION AND PLAN:  59 year old Caucasian female with a history of hypertension, diabetes mellitus type 2, hep C, CK D stage 3, diastolic congestive heart failure presents to the emergency room with the complaints of acute onset of retrosternal chest pain which is sharp in character, no radiation of pain and increases with  respiration and chest wall movements, CT chest negative for pulmonary embolism but positive for pericardial thickening/fluid concerning for pericarditis. 1. Retrosternal chest pain, sharp in character, increasing with respiration and chest wall movement. CT chest negative for pulmonary embolism but positive for pericardial thickening/fluid concerning for pericarditis. Troponin 2 WNL. Sedimentation rate elevated 58         Plan: Admit, telemetry monitoring, IV pain control medications including Toradol and morphine, cycle cardiac enzymes. Order 2-D echocardiogram and cardiology consultation for further evaluation and advice. 2. Hypertension, not optimally controlled. Likely secondary to pain. Patient on multiple anti-hypertensives. Continue home medications, pain control medications. Follow BP  measurements. 3. Diabetes mellitus type 2, stable clinically. Hold metformin because of IV contrast. Sliding scale insulin follow-up blood sugars closely.  4. CK D stage III. Creatinine now 0.89. No acute problems. Monitor 5. History of diastolic congestive heart failure, stable clinically.  6. Depression, stable on home medications. Continue same. 7. History of hep C, stable clinically. Check LFTs. Follow-up accordingly. 8. GERD, stable on PPI. Continue same.    All the records are reviewed and case discussed with ED provider. Management plans discussed with the patient and  in agreement.  CODE STATUS: Full code  TOTAL TIME TAKING CARE OF THIS PATIENT: 50 minutes.    Crissie Figures M.D on 01/20/2015 at 6:38 AM  Between 7am to 6pm - Pager - [phone]  After 6pm go to [email or web address] - password [email or web address] [facility]  [facility] [facility]  Office  [phone]  CC: Primary care physician; WHITE, Arlyss Repress, NP

## 2015-01-20 NOTE — Progress Notes (Signed)
Patient called nurses' station requesting pain medication. When RN came into room to administer MSO4, patient in bed, with eyes closed, not awaken my noise. Will continue to monitor.

## 2015-01-21 LAB — CBC
HCT: 27.4 % — ABNORMAL LOW (ref 35.0–47.0)
HEMOGLOBIN: 8.9 g/dL — AB (ref 12.0–16.0)
MCH: 26.6 pg (ref 26.0–34.0)
MCHC: 32.6 g/dL (ref 32.0–36.0)
MCV: 81.8 fL (ref 80.0–100.0)
Platelets: 109 10*3/uL — ABNORMAL LOW (ref 150–440)
RBC: 3.35 MIL/uL — AB (ref 3.80–5.20)
RDW: 15.4 % — ABNORMAL HIGH (ref 11.5–14.5)
WBC: 7.8 10*3/uL (ref 3.6–11.0)

## 2015-01-21 LAB — GLUCOSE, CAPILLARY
GLUCOSE-CAPILLARY: 169 mg/dL — AB (ref 65–99)
GLUCOSE-CAPILLARY: 252 mg/dL — AB (ref 65–99)
Glucose-Capillary: 127 mg/dL — ABNORMAL HIGH (ref 65–99)
Glucose-Capillary: 310 mg/dL — ABNORMAL HIGH (ref 65–99)

## 2015-01-21 LAB — BASIC METABOLIC PANEL
Anion gap: 5 (ref 5–15)
BUN: 35 mg/dL — ABNORMAL HIGH (ref 6–20)
CHLORIDE: 106 mmol/L (ref 101–111)
CO2: 29 mmol/L (ref 22–32)
CREATININE: 1.24 mg/dL — AB (ref 0.44–1.00)
Calcium: 8.8 mg/dL — ABNORMAL LOW (ref 8.9–10.3)
GFR, EST AFRICAN AMERICAN: 55 mL/min — AB (ref >60)
GFR, EST NON AFRICAN AMERICAN: 48 mL/min — AB (ref >60)
Glucose, Bld: 185 mg/dL — ABNORMAL HIGH (ref 65–99)
POTASSIUM: 3.8 mmol/L (ref 3.5–5.1)
SODIUM: 140 mmol/L (ref 135–145)

## 2015-01-21 NOTE — Progress Notes (Signed)
Room air. NSR. Takes meds ok. Up to chair and tolerated it well. Pt has not reported any pain. FS are stable. - DVT. Worked with PT and tolerated it well. Pt has no further concerns at this time.

## 2015-01-21 NOTE — Discharge Instructions (Signed)
[facility].  [facility]ENCY SHOULD CALL YOU WITHIN 24 HOURS OF DISCHARGE TO SCHEDULE APPT.  CALL IF DO NOT HEAR FROM THEM [phone]

## 2015-01-21 NOTE — Progress Notes (Signed)
[facility] Physicians - [place] at [facility]   PATIENT NAME: Jamie Austin    MR#:  [id number]  DATE OF BIRTH:  1955-08-09  SUBJECTIVE:  CHIEF COMPLAINT:   Chief Complaint  Patient presents with  . Chest Pain    Still have tightness in chest and some SOB. Chest pain still present.  REVIEW OF SYSTEMS:  CONSTITUTIONAL: No fever, fatigue or weakness.  EYES: No blurred or double vision.  EARS, NOSE, AND THROAT: No tinnitus or ear pain.  RESPIRATORY: No cough, shortness of breath, wheezing or hemoptysis.  CARDIOVASCULAR: No chest pain, orthopnea, edema.  GASTROINTESTINAL: No nausea, vomiting, diarrhea or abdominal pain.  GENITOURINARY: No dysuria, hematuria.  ENDOCRINE: No polyuria, nocturia,  HEMATOLOGY: No anemia, easy bruising or bleeding SKIN: No rash or lesion. MUSCULOSKELETAL: No joint pain or arthritis. Swelling on legs.   NEUROLOGIC: No tingling, numbness, weakness.  PSYCHIATRY: No anxiety or depression.   ROS  DRUG ALLERGIES:  No Known Allergies  VITALS:  Blood pressure 108/50, pulse 70, temperature 98 F (36.7 C), temperature source Oral, resp. rate 20, height  (1.575 m), weight 97.16 kg (214 lb 3.2 oz), SpO2 98 %.  PHYSICAL EXAMINATION:  GENERAL:  59 y.o.-year-old patient lying in the bed with no acute distress.  EYES: Pupils equal, round, reactive to light and accommodation. No scleral icterus. Extraocular muscles intact.  HEENT: Head atraumatic, normocephalic. Oropharynx and nasopharynx clear.  NECK:  Supple, no jugular venous distention. No thyroid enlargement, no tenderness.  LUNGS: Normal breath sounds bilaterally, no wheezing, rales,rhonchi or crepitation. No use of accessory muscles of respiration.  CARDIOVASCULAR: S1, S2 normal. No murmurs, rubs, or gallops.  ABDOMEN: Soft, nontender, nondistended. Bowel sounds present. No organomegaly or mass.  EXTREMITIES: positive for pedal edema,  No cyanosis, or clubbing.  NEUROLOGIC: Cranial  nerves II through XII are intact. Muscle strength 4/5 in all extremities. Sensation intact. Gait not checked.  PSYCHIATRIC: The patient is alert and oriented x 3.  SKIN: No obvious rash, lesion, or ulcer.   Physical Exam LABORATORY PANEL:   CBC  Recent Labs Lab 01/21/15 0328  WBC 7.8  HGB 8.9*  HCT 27.4*  PLT 109*   ------------------------------------------------------------------------------------------------------------------  Chemistries   Recent Labs Lab 01/20/15 1034 01/21/15 0328  NA  --  140  K  --  3.8  CL  --  106  CO2  --  29  GLUCOSE  --  185*  BUN  --  35*  CREATININE  --  1.24*  CALCIUM  --  8.8*  AST 20  --   ALT 15  --   ALKPHOS 68  --   BILITOT 0.8  --    ------------------------------------------------------------------------------------------------------------------  Cardiac Enzymes  Recent Labs Lab 01/20/15 1419 01/20/15 2105  TROPONINI <0.03 <0.03   ------------------------------------------------------------------------------------------------------------------  RADIOLOGY:  Dg Chest 2 View  01/20/2015   CLINICAL DATA:  Chest pain  EXAM: CHEST  2 VIEW  COMPARISON:  11/03/2014  FINDINGS: Chronic cardiomegaly; stable mediastinal contours. There is no edema, consolidation, effusion, or pneumothorax. No osseous findings to explain acute chest pain.  IMPRESSION: Chronic cardiomegaly.  No evidence of acute disease.   Electronically Signed   By: Marnee Spring M.D.   On: 01/20/2015 02:58   Ct Angio Chest Pe W/cm &/or Wo Cm  01/20/2015   CLINICAL DATA:  Chest pain with inspiration.  EXAM: CT ANGIOGRAPHY CHEST WITH CONTRAST  TECHNIQUE: Multidetector CT imaging of the chest was performed using the standard protocol during bolus  administration of intravenous contrast. Multiplanar CT image reconstructions and MIPs were obtained to evaluate the vascular anatomy.  CONTRAST:  75mL OMNIPAQUE IOHEXOL 350 MG/ML SOLN  COMPARISON:  None.  FINDINGS: THORACIC  INLET/BODY WALL:  No acute abnormality.  MEDIASTINUM:  Cardiomegaly, chronic by radiography. Trace pericardial fluid or thickening without definite enhancement.  Enlarged main pulmonary artery measuring 36 mm in diameter. No evidence of pulmonary embolism. No acute aortic finding.  LUNG WINDOWS:  No consolidation.  No effusion.  Two closely positioned nodules in the right middle lobe, measuring up to 6 mm individually.  UPPER ABDOMEN:  Large caudate lobe, hepatic fissures, with ventral left lobe nodularity. In this patient with hepatitis-C, these are findings of cirrhosis.  OSSEOUS:  No acute fracture.  No suspicious lytic or blastic lesions.  Review of the MIP images confirms the above findings.  IMPRESSION: 1. No evidence of pulmonary embolism. 2. Mild pericardial thickening/fluid. Could patient's symptoms be pericarditis? 3. Cirrhotic liver morphology and history of hepatitis-C. Recommend GI referral. 4. Right middle lobe pulmonary nodules, with clustering favoring a postinflammatory process, up to 6 mm. Given smoking history, follow-up chest CT at 6 - 12 months is recommended. This recommendation follows the consensus statement: Guidelines for Management of Small Pulmonary Nodules Detected on CT Scans: A Statement from the Fleischner Society as published in Radiology 2005;237:395-400. 5. Chronic cardiomegaly. Dilated main pulmonary artery as seen with pulmonary hypertension.   Electronically Signed   By: Marnee Spring M.D.   On: 01/20/2015 03:41   US Venous Img Lower Bilateral  01/21/2015   CLINICAL DATA:  59 year old female with a history of bilateral lower extremity swelling for 6 months.  EXAM: BILATERAL LOWER EXTREMITY VENOUS DOPPLER ULTRASOUND  TECHNIQUE: Gray-scale sonography with graded compression, as well as color Doppler and duplex ultrasound were performed to evaluate the lower extremity deep venous systems from the level of the common femoral vein and including the common femoral, femoral,  profunda femoral, popliteal and calf veins including the posterior tibial, peroneal and gastrocnemius veins when visible. The superficial great saphenous vein was also interrogated. Spectral Doppler was utilized to evaluate flow at rest and with distal augmentation maneuvers in the common femoral, femoral and popliteal veins.  COMPARISON:  None.  FINDINGS: RIGHT LOWER EXTREMITY  Common Femoral Vein: No evidence of thrombus. Normal compressibility, respiratory phasicity and response to augmentation.  Saphenofemoral Junction: No evidence of thrombus. Normal compressibility and flow on color Doppler imaging.  Profunda Femoral Vein: No evidence of thrombus. Normal compressibility and flow on color Doppler imaging.  Femoral Vein: No evidence of thrombus. Normal compressibility, respiratory phasicity and response to augmentation.  Popliteal Vein: No evidence of thrombus. Normal compressibility, respiratory phasicity and response to augmentation.  Calf Veins: No evidence of thrombus. Normal compressibility and flow on color Doppler imaging.  Superficial Great Saphenous Vein: No evidence of thrombus. Normal compressibility and flow on color Doppler imaging.  Other Findings:  None.  LEFT LOWER EXTREMITY  Common Femoral Vein: No evidence of thrombus. Normal compressibility, respiratory phasicity and response to augmentation.  Saphenofemoral Junction: No evidence of thrombus. Normal compressibility and flow on color Doppler imaging.  Profunda Femoral Vein: No evidence of thrombus. Normal compressibility and flow on color Doppler imaging.  Femoral Vein: No evidence of thrombus. Normal compressibility, respiratory phasicity and response to augmentation.  Popliteal Vein: No evidence of thrombus. Normal compressibility, respiratory phasicity and response to augmentation.  Calf Veins: No evidence of thrombus. Normal compressibility and flow on color Doppler imaging.  Superficial Great Saphenous Vein: No evidence of thrombus. Normal  compressibility and flow on color Doppler imaging.  Other Findings:  None.  IMPRESSION: Sonographic survey of the bilateral lower extremities negative for DVT.  Signed,  Yvone Neu. Loreta Ave, DO  [facility]  [facility]   Electronically Signed   By: Gilmer Mor D.O.   On: 01/21/2015 07:59    ASSESSMENT AND PLAN:   59 year old Caucasian female with a history of hypertension, diabetes mellitus type 2, hep C, CK D stage 3, diastolic congestive heart failure presents to the emergency room with the complaints of acute onset of retrosternal chest pain which is sharp in character, no radiation of pain and increases with respiration and chest wall movements, CT chest negative for pulmonary embolism but positive for pericardial thickening/fluid concerning for pericarditis. 1. Retrosternal chest pain,   Pericarditis   Appreciated cardiology consult    Started on colchicin. 2. Hypertension, not optimally controlled. Likely secondary to pain.  Patient on multiple anti-hypertensives. Continue home medications, pain control medications. Follow BP measurements. 3. Diabetes mellitus type 2, stable clinically. Hold metformin because of IV contrast. Sliding scale insulin follow-up blood sugars closely.  4. CK D stage III. Creatinine now 0.89. No acute problems. Monitor 5. History of diastolic congestive heart failure, stable clinically.  6. Depression, stable on home medications. Continue same. 7. History of hep C, stable clinically. Check LFTs. Follow-up accordingly. 8. GERD, stable on PPI. Continue same. 9. Generalized weakness, falls, leg swellings.    appreciat PT eval and negative Doppler studies of Lower extrimities.  All the records are reviewed and case discussed with Care Management/Social Workerr. Management plans discussed with the patient, family and they are in agreement.  CODE STATUS: full  TOTAL TIME TAKING CARE OF THIS PATIENT: 35 minutes.     POSSIBLE D/C IN 1-2 DAYS, DEPENDING ON CLINICAL CONDITION.   Altamese Dilling M.D on 01/21/2015   Between 7am to 6pm - Pager - [phone]  After 6pm go to [email or web address] - password [email or web address]  [place] [facility]  Office  [phone]  CC: Primary care physician; WHITE, Arlyss Repress, NP  Note: This dictation was prepared with Dragon dictation along with smaller phrase technology. Any transcriptional errors that result from this process are unintentional.

## 2015-01-21 NOTE — Care Management (Signed)
Order present for CM assessment for discharge planning.  Patient admitted under  with chest pain.  There are concerns for pericarditis.  Patient would benefit from home health nursing for assessment and observation/ assess med administration and assess home safety.  [facility]. [facility] preference is  [facility].  Referral called.  Confirmed PCP and contact information.  Son will transportt patient home

## 2015-01-21 NOTE — Clinical Social Work Note (Signed)
CSW spoke to pt.  She was alert and Ox4.  She stated that she was not interested in ALF placement, but accpeted information for possible future placement.  CSW gave ALF packet to pt.  CSW signing off unless further needs arise.

## 2015-01-21 NOTE — Progress Notes (Signed)
Inpatient Diabetes Program Recommendations  AACE/ADA: New Consensus Statement on Inpatient Glycemic Control (2015)  Target Ranges:  Prepandial:   less than 140 mg/dL      Peak postprandial:   less than 180 mg/dL (1-2 hours)      Critically ill patients:  140 - 180 mg/dL   Results for Jamie Austin, Jamie Austin (MRN [id number]) as of 01/21/2015 08:50  Ref. Range 11/03/2014 13:11  Hemoglobin A1C Latest Ref Range: 4.0-6.0 % 6.4 (H)    Review of Glycemic Control  Diabetes history:Type 2 Outpatient Diabetes medications: Metformin  bid Current orders for Inpatient glycemic control: Novolog 0-9 units tid with meals  Inpatient Diabetes Program Recommendations:  Agree with current orders for glycemic control  Susette Racer, RN, [place], [place], CDE Diabetes Coordinator Inpatient Diabetes Program  [phone] (Team Pager) [phone] [facility] Office) 01/21/2015 8:52 AM

## 2015-01-21 NOTE — Evaluation (Signed)
Physical Therapy Evaluation Patient Details Name: Jamie Austin MRN: [id number] DOB: 05/23/1955 Today's Date: 01/21/2015   History of Present Illness  Patient is a 59 y/o female that presents with retrosternal pain, believed to be pericarditis. Patient has been tested for PE and DVTs which have come back negative.   Clinical Impression  Patient is a pleasant 59 y/o female that presents with retrosternal pain. She states she has been ambulatory at home without an AD prior to this encounter, however she begins reaching for handhold assistance immediately upon standing and is very off balance. PT provided RW, and patient displays much better control of her COM, though her gait speed is indicative of a high falls risk. Patient completed 5x sit to stand in 17 seconds with use of HHA bilaterally also indicative of high falls risk. PT provided patient one additional opportunity to ambulate without RW and staggered laterally and required PT assist to regain balance. PT educated patient extensively on her high falls risk and need to use RW at all times. Skilled acute PT services are indicated for her mobility deficits.     Follow Up Recommendations Home health PT    Equipment Recommendations       Recommendations for Other Services       Precautions / Restrictions Precautions Precautions: Fall Restrictions Weight Bearing Restrictions: No      Mobility  Bed Mobility Overal bed mobility: Independent             General bed mobility comments: No difficulties noted with bed mobility   Transfers Overall transfer level: Modified independent Equipment used: Rolling walker (2 wheeled)             General transfer comment: Patient is able to complete 5x sit to stand in 17 seconds from chair in room with use of UEs. No loss of balance noted.   Ambulation/Gait Ambulation/Gait assistance: Supervision Ambulation Distance (Feet): 200 Feet Assistive device: Rolling walker (2 wheeled) Gait  Pattern/deviations: WFL(Within Functional Limits)   Gait velocity interpretation: <1.8 ft/sec, indicative of risk for recurrent falls General Gait Details: On two occassions PT attempted ambulation without RW, however patient began to fall or lose balance immediately. With RW no loss of balance noted, reciprocal gait pattern with decreased gait speed however appropriate directional skills noted.   Stairs            Wheelchair Mobility    Modified Rankin (Stroke Patients Only)       Balance Overall balance assessment: Needs assistance Sitting-balance support: Feet unsupported Sitting balance-Leahy Scale: Good Sitting balance - Comments: No balance deficits noted in sitting.    Standing balance support: No upper extremity supported Standing balance-Leahy Scale: Poor Standing balance comment: Patient attempted to ambulate initially and at the end without RW and began to lose balance and require PT assist to correct both times. With RW, modified DGI of 11/12 indicating low falls risk.                              Pertinent Vitals/Pain Pain Assessment:  (Patient states her chest pain is much less than initially, some remains. )    Home Living Family/patient expects to be discharged to:: Private residence Living Arrangements: Children Available Help at Discharge: Family Type of Home: House Home Access: Stairs to enter Entrance Stairs-Rails: Can reach both Entrance Stairs-Number of Steps: 3 Home Layout: One level Home Equipment: Walker - 2 wheels;Cane - single point Additional Comments:  Patient does not use an AD at baseline, but has them at the house.     Prior Function Level of Independence: Independent         Comments: Patient had been independent for short community distances, however she has had several falls this year.      Hand Dominance        Extremity/Trunk Assessment   Upper Extremity Assessment: Overall WFL for tasks assessed            Lower Extremity Assessment: Overall WFL for tasks assessed      Cervical / Trunk Assessment: Normal  Communication   Communication: No difficulties  Cognition Arousal/Alertness: Awake/alert Behavior During Therapy: WFL for tasks assessed/performed Overall Cognitive Status: Within Functional Limits for tasks assessed                      General Comments      Exercises        Assessment/Plan    PT Assessment Patient needs continued PT services  PT Diagnosis Difficulty walking;Generalized weakness   PT Problem List Decreased strength;Decreased safety awareness;Decreased knowledge of use of DME;Decreased balance;Decreased mobility  PT Treatment Interventions DME instruction;Gait training;Therapeutic activities;Therapeutic exercise;Balance training;Neuromuscular re-education   PT Goals (Current goals can be found in the Care Plan section) Acute Rehab PT Goals Patient Stated Goal: To return home  PT Goal Formulation: With patient Time For Goal Achievement: 02/04/15 Potential to Achieve Goals: Good    Frequency Min 2X/week   Barriers to discharge Decreased caregiver support Patient is at home by herself throughout the day.     Co-evaluation               End of Session Equipment Utilized During Treatment: Gait belt Activity Tolerance: Patient tolerated treatment well Patient left: in chair;with call bell/phone within reach;with chair alarm set Nurse Communication: Mobility status;Precautions         Time: 4540-9811 PT Time Calculation (min) (ACUTE ONLY): 27 min   Charges:   PT Evaluation $Initial PT Evaluation Tier I: 1 Procedure PT Treatments $Therapeutic Activity: 8-22 mins (Assisted patient with bathroom use)   PT G Codes:       Kerin Ransom, PT, DPT    01/21/2015, 2:48 PM

## 2015-01-22 LAB — GLUCOSE, CAPILLARY
GLUCOSE-CAPILLARY: 191 mg/dL — AB (ref 65–99)
GLUCOSE-CAPILLARY: 235 mg/dL — AB (ref 65–99)

## 2015-01-22 MED ORDER — COLCHICINE 0.6 MG PO TABS: 0.6000 mg | ORAL_TABLET | Freq: Two times a day (BID) | ORAL | Status: AC

## 2015-01-22 NOTE — Discharge Summary (Signed)
[facility] [place] [facility]   PATIENT NAME: Jamie Austin    MR#:  [id number]  DATE OF BIRTH:  March 11, 1956  DATE OF ADMISSION:  01/20/2015 ADMITTING PHYSICIAN: Crissie Figures, MD  DATE OF DISCHARGE:01/22/2015  PRIMARY CARE PHYSICIAN: WHITE, Arlyss Repress, NP   ADMISSION DIAGNOSIS:  Pericarditis [I31.9] Hypertensive urgency [I10] Chest pain, unspecified chest pain type [R07.9]  DISCHARGE DIAGNOSIS:  Principal Problem:   Chest pain Active Problems:   Pericarditis   HTN (hypertension)   DM (diabetes mellitus)   CKD (chronic kidney disease)   SECONDARY DIAGNOSIS:   Past Medical History  Diagnosis Date  . Hypertension   . Diabetes mellitus without complication   . Anemia   . GERD (gastroesophageal reflux disease)   . Arthritis   . Depression   . Hepatitis C   . Hypokalemia   . Diverticulitis   . Bronchitis   . Renal disorder   . Back pain   . CHF (congestive heart failure)     HOSPITAL COURSE:   59 year old Caucasian female with a history of hypertension, diabetes mellitus type 2, hep C, CK D stage 3, diastolic congestive heart failure presents to the emergency room with the complaints of acute onset of retrosternal chest pain which is sharp in character, no radiation of pain and increases with respiration and chest wall movements, CT chest negative for pulmonary embolism but positive for pericardial thickening/fluid concerning for pericarditis. 1. Retrosternal chest pain,  Pericarditis  Appreciated cardiology consult  Started on colchicin.   Pt felt significantly better- now the pain is 5/10- Vs 10/10 on admisison.    Walking with walker to and from bathroom, get some SOB with that , but said- that is same as her baseline. 2. Hypertension, not optimally controlled. Likely secondary to pain. Patient on multiple anti-hypertensives. Continue home medications, pain control medications. Follow BP measurements. 3. Diabetes  mellitus type 2, stable clinically. Hold metformin because of IV contrast. Sliding scale insulin follow-up blood sugars closely.  4. CK D stage III. Creatinine now 0.89. No acute problems. Monitor 5. History of diastolic congestive heart failure, stable clinically.  6. Depression, stable on home medications. Continue same. 7. History of hep C, stable clinically. Check LFTs. Follow-up accordingly. 8. GERD, stable on PPI. Continue same. 9. Generalized weakness, falls, leg swellings.  appreciat PT eval and negative Doppler studies of Lower extrimities.    Will get HHA and PT on d/c.  DISCHARGE CONDITIONS:   Stable.  CONSULTS OBTAINED:  Treatment Team:  Dalia Heading, MD  DRUG ALLERGIES:  No Known Allergies  DISCHARGE MEDICATIONS:   Current Discharge Medication List    START taking these medications   Details  colchicine 0.6 MG tablet Take 1 tablet (0.6 mg total) by mouth 2 (two) times daily. Qty: 14 tablet, Refills: 0      CONTINUE these medications which have NOT CHANGED   Details  acetaminophen (TYLENOL) 325 MG tablet Take 2 tablets (650 mg total) by mouth every 6 (six) hours as needed for mild pain (or Fever >/= 101). Qty: 1 tablet, Refills: 0    albuterol (PROVENTIL HFA;VENTOLIN HFA) 108 (90 BASE) MCG/ACT inhaler Inhale 2 puffs into the lungs every 6 (six) hours as needed for wheezing.     amLODipine (NORVASC) 10 MG tablet Take 10 mg by mouth daily.    aspirin EC 81 MG tablet Take 81 mg by mouth daily.    atorvastatin (LIPITOR) 40 MG tablet Take 40 mg by  mouth daily.    carvedilol (COREG) 25 MG tablet Take 25 mg by mouth 2 (two) times daily with a meal.    cloNIDine (CATAPRES) 0.1 MG tablet Take 1 tablet (0.1 mg total) by mouth daily. Qty: 60 tablet, Refills: 11    ferrous sulfate 325 (65 FE) MG tablet Take 325 mg by mouth daily with breakfast.    gabapentin (NEURONTIN) 400 MG capsule Take 800 mg by mouth 3 (three) times daily.    hydrALAZINE (APRESOLINE)  100 MG tablet Take 100 mg by mouth 3 (three) times daily.    lisinopril (PRINIVIL,ZESTRIL) 40 MG tablet Take 40 mg by mouth daily.    metFORMIN (GLUCOPHAGE) 1000 MG tablet Take 1,000 mg by mouth 2 (two) times daily with a meal.    omeprazole (PRILOSEC) 20 MG capsule Take 20 mg by mouth 2 (two) times daily before a meal.    potassium chloride SA (K-DUR,KLOR-CON) 20 MEQ tablet Take 20 mEq by mouth 2 (two) times daily.    sertraline (ZOLOFT) 100 MG tablet Take 150 mg by mouth daily.         DISCHARGE INSTRUCTIONS:    Follow with Cardiology in office in 1-2 weeks.  If you experience worsening of your admission symptoms, develop shortness of breath, life threatening emergency, suicidal or homicidal thoughts you must seek medical attention immediately by calling 911 or calling your MD immediately  if symptoms less severe.  You Must read complete instructions/literature along with all the possible adverse reactions/side effects for all the Medicines you take and that have been prescribed to you. Take any new Medicines after you have completely understood and accept all the possible adverse reactions/side effects.   Please note  You were cared for by a hospitalist during your hospital stay. If you have any questions about your discharge medications or the care you received while you were in the hospital after you are discharged, you can call the unit and asked to speak with the hospitalist on call if the hospitalist that took care of you is not available. Once you are discharged, your primary care physician will handle any further medical issues. Please note that NO REFILLS for any discharge medications will be authorized once you are discharged, as it is imperative that you return to your primary care physician (or establish a relationship with a primary care physician if you do not have one) for your aftercare needs so that they can reassess your need for medications and monitor your lab  values.    Today   CHIEF COMPLAINT:   Chief Complaint  Patient presents with  . Chest Pain    HISTORY OF PRESENT ILLNESS:  Jamie Austin  is a 59 y.o. female with a known history of hypertension, diabetes mellitus type 2, CK D stage III, hep C, GERD, chronic diastolic congestive heart failure, depression presents to the emergency room with the complaints of acute onset of retrosternal chest pain which started around 11 PM last night while watching television. No radiation of chest pain. Chest pain sharp in character and not associated with any shortness of breath, palpitations, dizziness. Chest pain increases on deep inspiration and movements of the chest. Denies any fever, cough, nausea, vomiting,abdominal pain, dysuria. Evaluation in the ED revealed elevated systolic blood pressure in the range of 180. EKG normal sinus rhythm with ventricular rate of 76 bpm, LAD. Chest x-ray unremarkable for any acute cardiopulmonary pathology. Lab work showed elevated WBC of 11.2, H&H 10.08/33.3, potassium 3.2, BUN/creatinine 24/0.89, troponin less  than 0.03, elevated sedimentation rate of 58, elevated d-dimer of 761. Patient underwent CT chest for PE which is reported negative for pulmonary embolism, no consolidation, no effusion but mild pericardial thickening/fluid concerning for pericarditis. Patient was given IV Toradol and IV metoprolol but continues to have significant retrosternal chest pain hence hospitalist service was consulted for further management. Patient denies similar symptoms in the past. No recent URI infection.   VITAL SIGNS:  Blood pressure 135/56, pulse 53, temperature 97.7 F (36.5 C), temperature source Oral, resp. rate 19, height  (1.575 m), weight 98.612 kg (217 lb 6.4 oz), SpO2 98 %.  I/O:   Intake/Output Summary (Last 24 hours) at 01/22/15 1204 Last data filed at 01/22/15 1155  Gross per 24 hour  Intake    483 ml  Output   1050 ml  Net   -567 ml    PHYSICAL  EXAMINATION:   GENERAL: 59 y.o.-year-old patient lying in the bed with no acute distress.  EYES: Pupils equal, round, reactive to light and accommodation. No scleral icterus. Extraocular muscles intact.  HEENT: Head atraumatic, normocephalic. Oropharynx and nasopharynx clear.  NECK: Supple, no jugular venous distention. No thyroid enlargement, no tenderness.  LUNGS: Normal breath sounds bilaterally, no wheezing, rales,rhonchi or crepitation. No use of accessory muscles of respiration.  CARDIOVASCULAR: S1, S2 normal. No murmurs, rubs, or gallops.  ABDOMEN: Soft, nontender, nondistended. Bowel sounds present. No organomegaly or mass.  EXTREMITIES: positive for pedal edema, No cyanosis, or clubbing.  NEUROLOGIC: Cranial nerves II through XII are intact. Muscle strength 4/5 in all extremities. Sensation intact. Gait not checked.  PSYCHIATRIC: The patient is alert and oriented x 3.  SKIN: No obvious rash, lesion, or ulcer.   DATA REVIEW:   CBC  Recent Labs Lab 01/21/15 0328  WBC 7.8  HGB 8.9*  HCT 27.4*  PLT 109*    Chemistries   Recent Labs Lab 01/20/15 1034 01/21/15 0328  NA  --  140  K  --  3.8  CL  --  106  CO2  --  29  GLUCOSE  --  185*  BUN  --  35*  CREATININE  --  1.24*  CALCIUM  --  8.8*  AST 20  --   ALT 15  --   ALKPHOS 68  --   BILITOT 0.8  --     Cardiac Enzymes  Recent Labs Lab 01/20/15 2105  TROPONINI <0.03    Microbiology Results  Results for orders placed or performed during the hospital encounter of 11/03/14  Culture, blood (routine x 2)     Status: None   Collection Time: 11/03/14  1:11 PM  Result Value Ref Range Status   Specimen Description BLOOD LEFT ASSIST CONTROL  Final   Special Requests   Final    BOTTLES DRAWN AEROBIC AND ANAEROBIC  3 CC AEROBIC, 10 CC ANAEROBIC   Culture NO GROWTH 5 DAYS  Final   Report Status 11/08/2014 FINAL  Final  Culture, blood (routine x 2)     Status: None   Collection Time: 11/03/14  1:14 PM   Result Value Ref Range Status   Specimen Description BLOOD LEFT THUMB  Final   Special Requests   Final    BOTTLES DRAWN AEROBIC AND ANAEROBIC  5 CC AEOROBIC, 12 CC ANAEROBIC   Culture NO GROWTH 5 DAYS  Final   Report Status 11/08/2014 FINAL  Final    RADIOLOGY:  US Venous Img Lower Bilateral  01/21/2015   CLINICAL  DATA:  59 year old female with a history of bilateral lower extremity swelling for 6 months.  EXAM: BILATERAL LOWER EXTREMITY VENOUS DOPPLER ULTRASOUND  TECHNIQUE: Gray-scale sonography with graded compression, as well as color Doppler and duplex ultrasound were performed to evaluate the lower extremity deep venous systems from the level of the common femoral vein and including the common femoral, femoral, profunda femoral, popliteal and calf veins including the posterior tibial, peroneal and gastrocnemius veins when visible. The superficial great saphenous vein was also interrogated. Spectral Doppler was utilized to evaluate flow at rest and with distal augmentation maneuvers in the common femoral, femoral and popliteal veins.  COMPARISON:  None.  FINDINGS: RIGHT LOWER EXTREMITY  Common Femoral Vein: No evidence of thrombus. Normal compressibility, respiratory phasicity and response to augmentation.  Saphenofemoral Junction: No evidence of thrombus. Normal compressibility and flow on color Doppler imaging.  Profunda Femoral Vein: No evidence of thrombus. Normal compressibility and flow on color Doppler imaging.  Femoral Vein: No evidence of thrombus. Normal compressibility, respiratory phasicity and response to augmentation.  Popliteal Vein: No evidence of thrombus. Normal compressibility, respiratory phasicity and response to augmentation.  Calf Veins: No evidence of thrombus. Normal compressibility and flow on color Doppler imaging.  Superficial Great Saphenous Vein: No evidence of thrombus. Normal compressibility and flow on color Doppler imaging.  Other Findings:  None.  LEFT LOWER  EXTREMITY  Common Femoral Vein: No evidence of thrombus. Normal compressibility, respiratory phasicity and response to augmentation.  Saphenofemoral Junction: No evidence of thrombus. Normal compressibility and flow on color Doppler imaging.  Profunda Femoral Vein: No evidence of thrombus. Normal compressibility and flow on color Doppler imaging.  Femoral Vein: No evidence of thrombus. Normal compressibility, respiratory phasicity and response to augmentation.  Popliteal Vein: No evidence of thrombus. Normal compressibility, respiratory phasicity and response to augmentation.  Calf Veins: No evidence of thrombus. Normal compressibility and flow on color Doppler imaging.  Superficial Great Saphenous Vein: No evidence of thrombus. Normal compressibility and flow on color Doppler imaging.  Other Findings:  None.  IMPRESSION: Sonographic survey of the bilateral lower extremities negative for DVT.  Signed,  Yvone Neu. Loreta Ave, DO  [facility]  [facility]   Electronically Signed   By: Gilmer Mor D.O.   On: 01/21/2015 07:59     Management plans discussed with the patient, family and they are in agreement.  CODE STATUS:     Code Status Orders        Start     Ordered   01/20/15 0849  Full code   Continuous     01/20/15 0849      TOTAL TIME TAKING CARE OF THIS PATIENT: 35 minutes.    Altamese Dilling M.D on 01/22/2015 at 12:04 PM  Between 7am to 6pm - Pager - [phone]  After 6pm go to [email or web address] - password [email or web address]  [facility] [facility]  Office  [phone]  CC: Primary care physician; WHITE, Arlyss Repress, NP   Note: This dictation was prepared with Dragon dictation along with smaller phrase technology. Any transcriptional errors that result from this process are unintentional.

## 2015-01-22 NOTE — Care Management (Addendum)
Patient for discharge home with home health nursing through [facility].

## 2015-01-22 NOTE — Progress Notes (Signed)
[facility]  SUBJECTIVE: cp improved somewhat.    Filed Vitals:   01/21/15 2129 01/22/15 0010 01/22/15 0444 01/22/15 0720  BP: 143/61 135/66 149/74 150/62  Pulse: 60 60 56 58  Temp:   98.2 F (36.8 C)   TempSrc:   Oral   Resp:  18 20   Height:      Weight:   98.612 kg (217 lb 6.4 oz)   SpO2:   98% 97%    Intake/Output Summary (Last 24 hours) at 01/22/15 0836 Last data filed at 01/22/15 0647  Gross per 24 hour  Intake    243 ml  Output    500 ml  Net   -257 ml    LABS: Basic Metabolic Panel:  Recent Labs  40/98/11 0209 01/21/15 0328  NA 139 140  K 3.2* 3.8  CL 105 106  CO2 28 29  GLUCOSE 210* 185*  BUN 24* 35*  CREATININE 0.89 1.24*  CALCIUM 8.9 8.8*   Liver Function Tests:  Recent Labs  01/20/15 1034  AST 20  ALT 15  ALKPHOS 68  BILITOT 0.8  PROT 7.4  ALBUMIN 3.3*   No results for input(s): LIPASE, AMYLASE in the last 72 hours. CBC:  Recent Labs  01/20/15 0145 01/21/15 0328  WBC 11.2* 7.8  HGB 10.8* 8.9*  HCT 33.3* 27.4*  MCV 81.9 81.8  PLT 147* 109*   Cardiac Enzymes:  Recent Labs  01/20/15 1034 01/20/15 1419 01/20/15 2105  TROPONINI <0.03 <0.03 <0.03   BNP: Invalid input(s): POCBNP D-Dimer: No results for input(s): DDIMER in the last 72 hours. Hemoglobin A1C: No results for input(s): HGBA1C in the last 72 hours. Fasting Lipid Panel: No results for input(s): CHOL, HDL, LDLCALC, TRIG, CHOLHDL, LDLDIRECT in the last 72 hours. Thyroid Function Tests: No results for input(s): TSH, T4TOTAL, T3FREE, THYROIDAB in the last 72 hours.  Invalid input(s): FREET3 Anemia Panel: No results for input(s): VITAMINB12, FOLATE, FERRITIN, TIBC, IRON, RETICCTPCT in the last 72 hours.   Physical Exam: Blood pressure 150/62, pulse 58, temperature 98.2 F (36.8 C), temperature source Oral, resp. rate 20, height  (1.575 m), weight 98.612 kg (217 lb 6.4 oz), SpO2 97 %.    General appearance:  cooperative Resp: clear to auscultation bilaterally Cardio: regular rate and rhythm GI: soft, non-tender; bowel sounds normal; no masses,  no organomegaly Extremities: extremities normal, atraumatic, no cyanosis or edema Neurologic: Grossly normal  TELEMETRY: Reviewed telemetry pt in nsr:  ASSESSMENT AND PLAN:  Principal Problem:   Chest pain-probable pericarditis. On colchicine  OK for discharge from cardiac standpoint .   Active Problems:   Pericarditis   HTN (hypertension)   DM (diabetes mellitus)   CKD (chronic kidney disease)    Dalia Heading., MD, [facility] 01/22/2015 8:36 AM

## 2015-01-22 NOTE — Progress Notes (Signed)
A & O. NSR. Room air. Ambulated in the room. Pt reports no pain. IV and tele removed. Discharge instructions given to pt. Pt has no further concerns a this time.

## 2015-02-16 ENCOUNTER — Other Ambulatory Visit: Payer: Self-pay | Admitting: Gastroenterology

## 2015-02-16 DIAGNOSIS — K7460 Unspecified cirrhosis of liver: Principal | ICD-10-CM

## 2015-02-16 DIAGNOSIS — B182 Chronic viral hepatitis C: Secondary | ICD-10-CM

## 2015-02-23 ENCOUNTER — Ambulatory Visit
Admission: RE | Admit: 2015-02-23 | Discharge: 2015-02-23 | Disposition: A | Discharge status: Home / Self Care | Payer: Medicaid Other | Source: Ambulatory Visit | Attending: Gastroenterology | Admitting: Gastroenterology

## 2015-02-23 DIAGNOSIS — K7460 Unspecified cirrhosis of liver: Secondary | ICD-10-CM | POA: Diagnosis present

## 2015-02-23 DIAGNOSIS — K824 Cholesterolosis of gallbladder: Secondary | ICD-10-CM | POA: Insufficient documentation

## 2015-02-23 DIAGNOSIS — K8020 Calculus of gallbladder without cholecystitis without obstruction: Secondary | ICD-10-CM | POA: Diagnosis not present

## 2015-02-23 DIAGNOSIS — B182 Chronic viral hepatitis C: Secondary | ICD-10-CM | POA: Diagnosis present

## 2015-04-06 ENCOUNTER — Other Ambulatory Visit
Admission: RE | Admit: 2015-04-06 | Discharge: 2015-04-06 | Disposition: A | Discharge status: Home / Self Care | Payer: Medicaid Other | Source: Ambulatory Visit | Attending: Nephrology | Admitting: Nephrology

## 2015-04-06 DIAGNOSIS — N182 Chronic kidney disease, stage 2 (mild): Secondary | ICD-10-CM | POA: Insufficient documentation

## 2015-04-06 DIAGNOSIS — E559 Vitamin D deficiency, unspecified: Secondary | ICD-10-CM | POA: Insufficient documentation

## 2015-04-06 DIAGNOSIS — I129 Hypertensive chronic kidney disease with stage 1 through stage 4 chronic kidney disease, or unspecified chronic kidney disease: Secondary | ICD-10-CM | POA: Insufficient documentation

## 2015-04-06 LAB — COMPREHENSIVE METABOLIC PANEL
ALBUMIN: 3.6 g/dL (ref 3.5–5.0)
ALK PHOS: 74 U/L (ref 38–126)
ALT: 27 U/L (ref 14–54)
AST: 35 U/L (ref 15–41)
Anion gap: 9 (ref 5–15)
BUN: 24 mg/dL — ABNORMAL HIGH (ref 6–20)
CALCIUM: 9.4 mg/dL (ref 8.9–10.3)
CO2: 27 mmol/L (ref 22–32)
CREATININE: 0.99 mg/dL (ref 0.44–1.00)
Chloride: 103 mmol/L (ref 101–111)
GFR calc Af Amer: >60 mL/min (ref >60)
GFR calc non Af Amer: >60 mL/min (ref >60)
GLUCOSE: 195 mg/dL — AB (ref 65–99)
Potassium: 3.8 mmol/L (ref 3.5–5.1)
SODIUM: 139 mmol/L (ref 135–145)
Total Bilirubin: 0.4 mg/dL (ref 0.3–1.2)
Total Protein: 7.7 g/dL (ref 6.5–8.1)

## 2015-04-06 LAB — CBC WITH DIFFERENTIAL/PLATELET
BASOS PCT: 0 %
Basophils Absolute: 0 10*3/uL (ref 0–0.1)
EOS ABS: 0 10*3/uL (ref 0–0.7)
Eosinophils Relative: 1 %
HEMATOCRIT: 32.6 % — AB (ref 35.0–47.0)
Hemoglobin: 10.7 g/dL — ABNORMAL LOW (ref 12.0–16.0)
Lymphocytes Relative: 27 %
Lymphs Abs: 1.6 10*3/uL (ref 1.0–3.6)
MCH: 26.5 pg (ref 26.0–34.0)
MCHC: 32.8 g/dL (ref 32.0–36.0)
MCV: 80.6 fL (ref 80.0–100.0)
MONO ABS: 0.3 10*3/uL (ref 0.2–0.9)
MONOS PCT: 6 %
Neutro Abs: 4 10*3/uL (ref 1.4–6.5)
Neutrophils Relative %: 66 %
Platelets: 116 10*3/uL — ABNORMAL LOW (ref 150–440)
RBC: 4.04 MIL/uL (ref 3.80–5.20)
RDW: 17.8 % — AB (ref 11.5–14.5)
WBC: 6 10*3/uL (ref 3.6–11.0)

## 2015-04-07 LAB — VITAMIN D 25 HYDROXY (VIT D DEFICIENCY, FRACTURES): Vit D, 25-Hydroxy: 36.8 ng/mL (ref 30.0–100.0)

## 2015-04-10 LAB — PROTEIN / CREATININE RATIO, URINE
Creatinine, Urine: 146 mg/dL
Protein Creatinine Ratio: 2.84 mg/mg{Cre} — ABNORMAL HIGH (ref 0.00–0.15)
Total Protein, Urine: 414 mg/dL

## 2015-08-17 ENCOUNTER — Other Ambulatory Visit: Payer: Self-pay | Admitting: Gastroenterology

## 2015-08-17 DIAGNOSIS — K7460 Unspecified cirrhosis of liver: Secondary | ICD-10-CM

## 2015-08-24 ENCOUNTER — Ambulatory Visit
Admission: RE | Admit: 2015-08-24 | Discharge: 2015-08-24 | Disposition: A | Discharge status: Home / Self Care | Payer: Medicare (Managed Care) | Source: Ambulatory Visit | Attending: Gastroenterology | Admitting: Gastroenterology

## 2015-08-24 DIAGNOSIS — K824 Cholesterolosis of gallbladder: Secondary | ICD-10-CM | POA: Diagnosis not present

## 2015-08-24 DIAGNOSIS — K8020 Calculus of gallbladder without cholecystitis without obstruction: Secondary | ICD-10-CM | POA: Insufficient documentation

## 2015-08-24 DIAGNOSIS — K7460 Unspecified cirrhosis of liver: Secondary | ICD-10-CM

## 2016-03-31 DEATH — deceased

## 2016-04-13 IMAGING — MR MR HEAD W/O CM
10 series · 48 of 48 positions shown · non-contrast
Comparison: CT head 11/03/2014

CLINICAL DATA: Dizziness.

EXAM:
MRI HEAD WITHOUT CONTRAST
TECHNIQUE: Multiplanar, multiecho pulse sequences of the brain and surrounding
structures were obtained without intravenous contrast.

[Series 2: T1 · sagittal · 5.0mm · 0.45mm/px · 3 of 28 slices shown (1 of 2)]
[im 1/28]
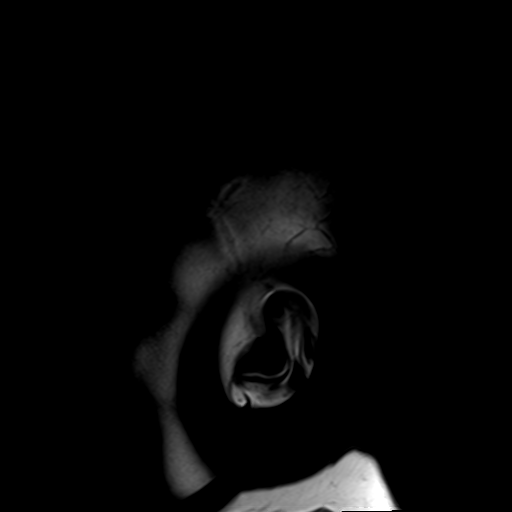
[im 14/28]
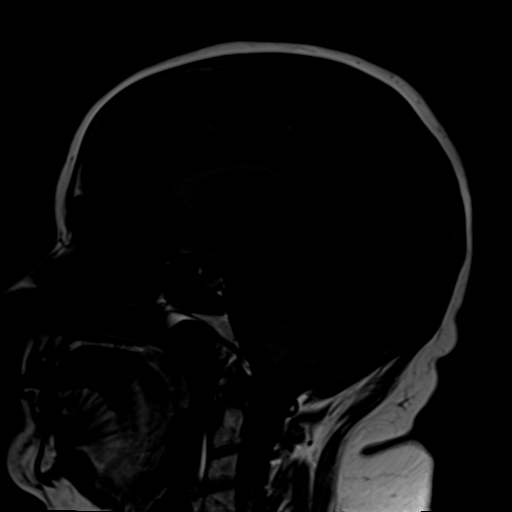
[im 28/28]
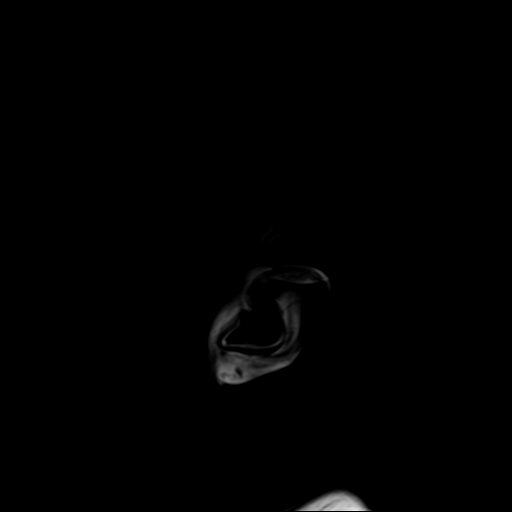

[Series 4: DWI · axial · 3.0mm · 1.80mm/px · z∈[-2,+154]mm · 7 of 55 slices shown (1 of 4)]
[im 1/55]
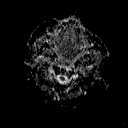
[im 10/55]
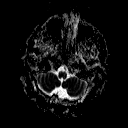
[im 19/55]
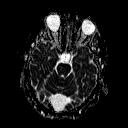
[im 28/55]
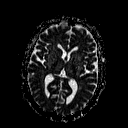
[im 37/55]
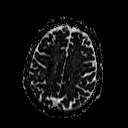
[im 46/55]
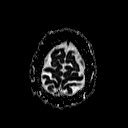
[im 55/55]
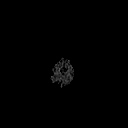

[Series 6: DWI · coronal · 3.0mm · 1.80mm/px · 6 of 49 slices shown (2 of 4)]
[im 1/49]
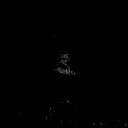
[im 10/49]
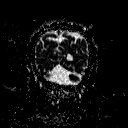
[im 20/49]
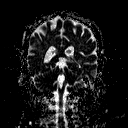
[im 29/49]
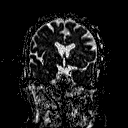
[im 39/49]
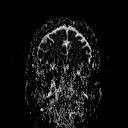
[im 49/49]
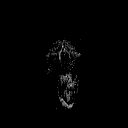

[Series 7: T2 · axial · 5.0mm · 0.60mm/px · z∈[-20,+145]mm · 3 of 27 slices shown (1 of 3)]
[im 1/27]
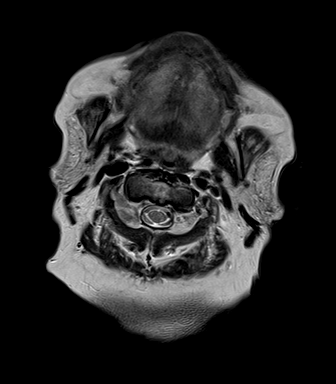
[im 14/27]
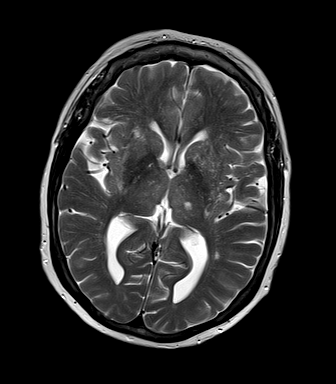
[im 27/27]
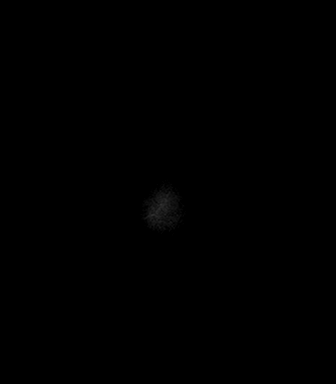

[Series 8: FLAIR · axial · 5.0mm · 0.45mm/px · z∈[-18,+147]mm · 3 of 27 slices shown]
[im 1/27]
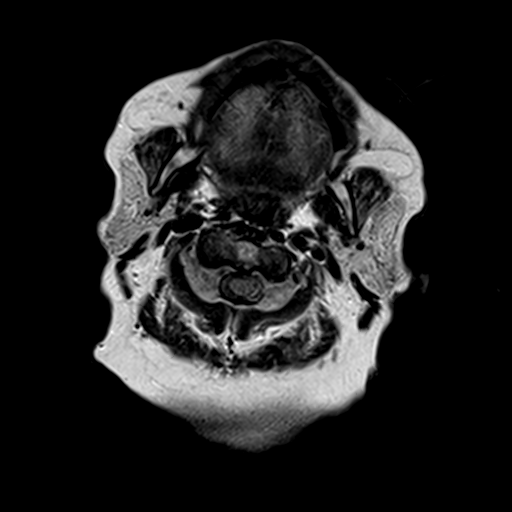
[im 14/27]
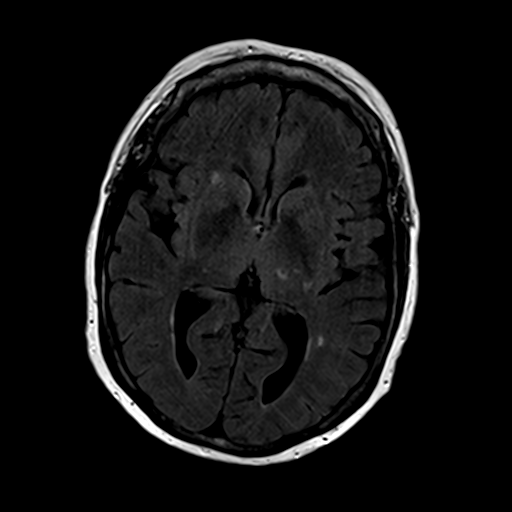
[im 27/27]
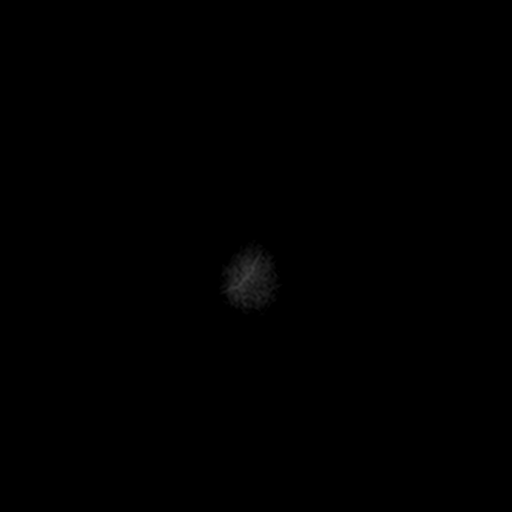

[Series 9: T2 · axial · 5.0mm · 0.45mm/px · z∈[-20,+145]mm · 3 of 27 slices shown (2 of 3)]
[im 1/27]
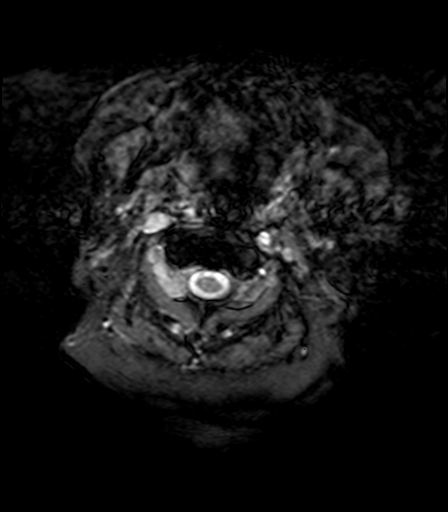
[im 14/27]
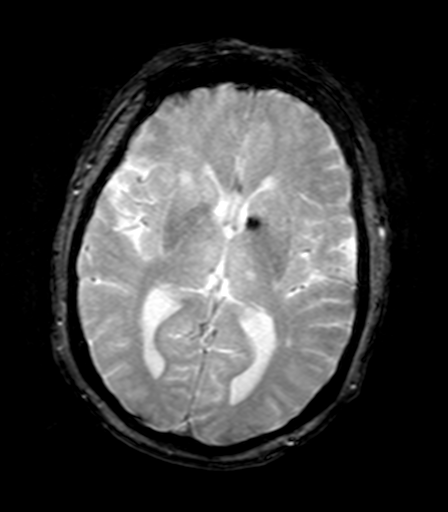
[im 27/27]
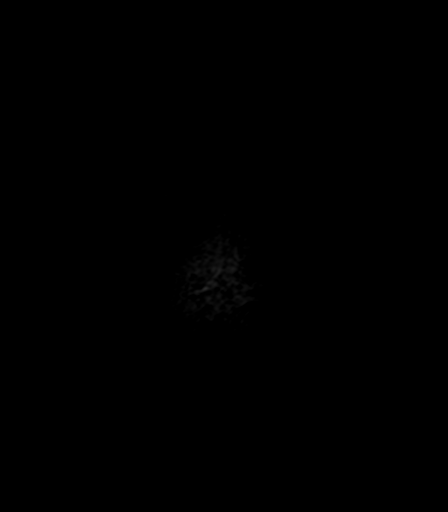

[Series 10: T1 · axial · 3.0mm · 1.00mm/px · z∈[-9,+151]mm · 7 of 56 slices shown (2 of 2)]
[im 1/56]
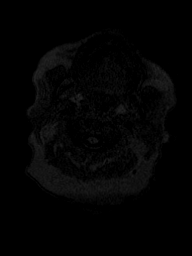
[im 10/56]
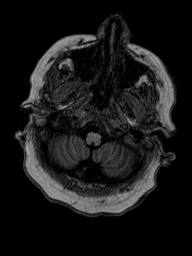
[im 19/56]
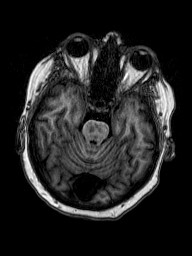
[im 28/56]
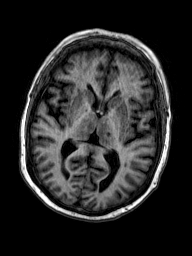
[im 37/56]
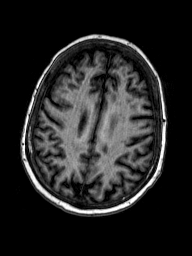
[im 46/56]
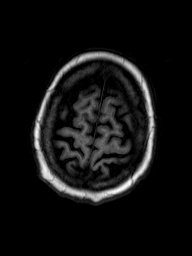
[im 56/56]
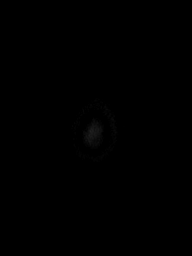

[Series 11: T2 · coronal · 5.0mm · 0.49mm/px · 4 of 30 slices shown (3 of 3)]
[im 1/30]
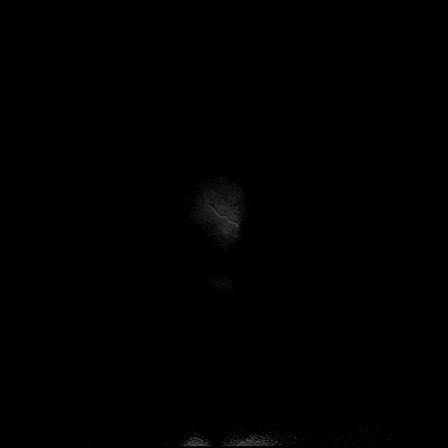
[im 10/30]
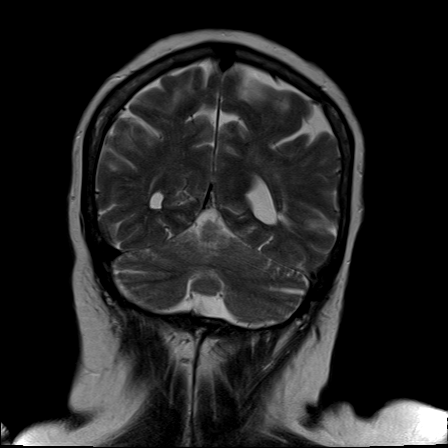
[im 20/30]
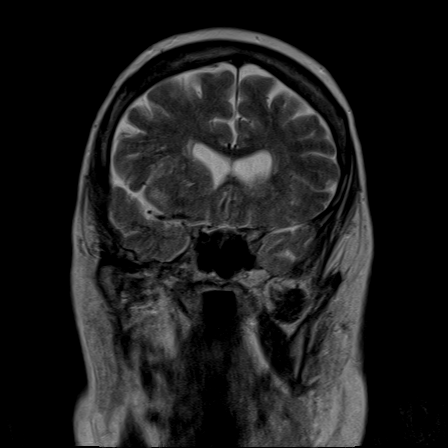
[im 30/30]
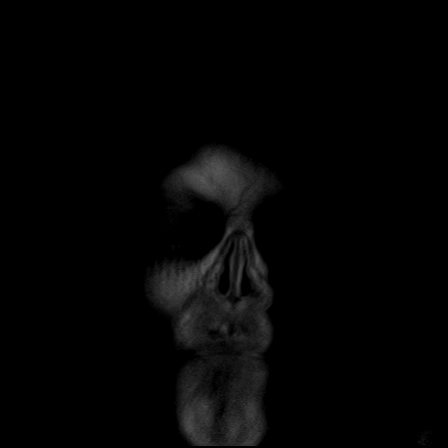

[Series 100: DWI · axial · 3.0mm · 1.80mm/px · z∈[-2,+154]mm · 6 of 54 slices shown (3 of 4)]
[im 1/54]
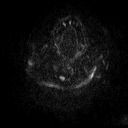
[im 11/54]
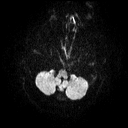
[im 22/54]
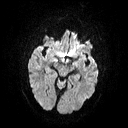
[im 32/54]
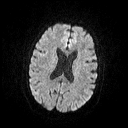
[im 43/54]
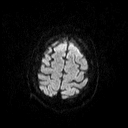
[im 54/54]
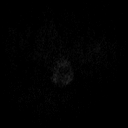

[Series 101: DWI · coronal · 3.0mm · 1.80mm/px · 6 of 49 slices shown (4 of 4)]
[im 1/49]
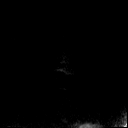
[im 10/49]
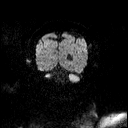
[im 20/49]
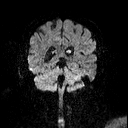
[im 29/49]
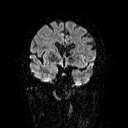
[im 39/49]
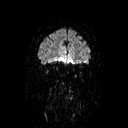
[im 49/49]
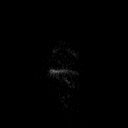

[48 of 48 positions shown; findings below may reference images not displayed]

FINDINGS: Cerebral volume normal for age. Negative for hydrocephalus. Retro
cerebellar cyst measures 2.9 x 3.3 cm and appears benign and
chronic. Probable arachnoid cyst or mega cisterna magna.

Negative for acute infarct. Chronic microvascular ischemic change in
the white matter is mild. Moderate chronic microvascular ischemic
change in the pons. Chronic lacunar infarction in the left thalamus.
Mild chronic ischemic change in the basal ganglia bilaterally.

8 x 10 mm chronic hemorrhage left body of caudate. This could
represent hypertensive hemorrhage or cavernoma. Additional micro
hemorrhage in the left parietal lobe.

Negative for mass lesion.  No shift of the midline structures

Mild mucosal edema paranasal sinuses. Pituitary not enlarged. No
orbital lesion.
IMPRESSION: Negative for acute infarct. Chronic microvascular ischemic changes,
most prominent in the brainstem

8 x 10 mm chronic hemorrhage left caudate possible cavernoma or
hypertensive hemorrhage. Additional small micro hemorrhage left
parietal lobe.

Retro cerebellar cyst appears benign.
